# Patient Record
Sex: Male | Born: 1935 | Race: White | Hispanic: Yes | Marital: Married | Smoking: Former smoker
Health system: Southern US, Community
[De-identification: ages and names within clinical notes are randomized; demographics above are authoritative.]

## PROBLEM LIST (undated history)

## (undated) DIAGNOSIS — C61 Malignant neoplasm of prostate: Secondary | ICD-10-CM

## (undated) DIAGNOSIS — I10 Essential (primary) hypertension: Secondary | ICD-10-CM

## (undated) DIAGNOSIS — H9190 Unspecified hearing loss, unspecified ear: Secondary | ICD-10-CM

## (undated) DIAGNOSIS — H409 Unspecified glaucoma: Secondary | ICD-10-CM

## (undated) DIAGNOSIS — K5790 Diverticulosis of intestine, part unspecified, without perforation or abscess without bleeding: Secondary | ICD-10-CM

## (undated) DIAGNOSIS — Z974 Presence of external hearing-aid: Secondary | ICD-10-CM

## (undated) DIAGNOSIS — M1990 Unspecified osteoarthritis, unspecified site: Secondary | ICD-10-CM

## (undated) DIAGNOSIS — E785 Hyperlipidemia, unspecified: Secondary | ICD-10-CM

## (undated) DIAGNOSIS — D369 Benign neoplasm, unspecified site: Secondary | ICD-10-CM

## (undated) DIAGNOSIS — J302 Other seasonal allergic rhinitis: Secondary | ICD-10-CM

## (undated) DIAGNOSIS — I2510 Atherosclerotic heart disease of native coronary artery without angina pectoris: Secondary | ICD-10-CM

## (undated) HISTORY — PX: PROSTATE SURGERY: SHX751

## (undated) HISTORY — DX: Benign neoplasm, unspecified site: D36.9

## (undated) HISTORY — DX: Hyperlipidemia, unspecified: E78.5

## (undated) HISTORY — PX: OTHER SURGICAL HISTORY: SHX169

## (undated) HISTORY — DX: Malignant neoplasm of prostate: C61

## (undated) HISTORY — DX: Diverticulosis of intestine, part unspecified, without perforation or abscess without bleeding: K57.90

## (undated) HISTORY — PX: HERNIA REPAIR: SHX51

## (undated) HISTORY — PX: EYE SURGERY: SHX253

## (undated) HISTORY — PX: TRANSURETHRAL RESECTION OF PROSTATE: SHX73

## (undated) HISTORY — DX: Atherosclerotic heart disease of native coronary artery without angina pectoris: I25.10

---

## 1968-02-01 HISTORY — PX: BACK SURGERY: SHX140

## 2004-03-03 ENCOUNTER — Encounter: Admission: RE | Admit: 2004-03-03 | Discharge: 2004-03-03 | Payer: Self-pay | Admitting: Gastroenterology

## 2005-02-15 ENCOUNTER — Encounter: Admission: RE | Admit: 2005-02-15 | Discharge: 2005-03-17 | Payer: Self-pay | Admitting: Family Medicine

## 2005-04-12 ENCOUNTER — Ambulatory Visit (HOSPITAL_COMMUNITY): Admission: RE | Admit: 2005-04-12 | Discharge: 2005-04-13 | Payer: Self-pay | Admitting: Surgery

## 2010-11-01 HISTORY — PX: HERNIA REPAIR: SHX51

## 2012-04-16 ENCOUNTER — Encounter (INDEPENDENT_AMBULATORY_CARE_PROVIDER_SITE_OTHER): Payer: Self-pay | Admitting: Surgery

## 2012-04-17 ENCOUNTER — Ambulatory Visit (INDEPENDENT_AMBULATORY_CARE_PROVIDER_SITE_OTHER): Payer: Self-pay | Admitting: Surgery

## 2012-04-24 ENCOUNTER — Encounter (INDEPENDENT_AMBULATORY_CARE_PROVIDER_SITE_OTHER): Payer: Self-pay | Admitting: Surgery

## 2012-04-24 ENCOUNTER — Encounter (INDEPENDENT_AMBULATORY_CARE_PROVIDER_SITE_OTHER): Payer: Self-pay | Admitting: General Surgery

## 2012-04-24 ENCOUNTER — Ambulatory Visit (INDEPENDENT_AMBULATORY_CARE_PROVIDER_SITE_OTHER): Payer: Medicare Other | Admitting: Surgery

## 2012-04-24 VITALS — BP 124/84 | HR 72 | Temp 98.2°F | Resp 16 | Ht 70.0 in | Wt 179.2 lb

## 2012-04-24 DIAGNOSIS — K4091 Unilateral inguinal hernia, without obstruction or gangrene, recurrent: Secondary | ICD-10-CM

## 2012-04-24 NOTE — Progress Notes (Signed)
Patient ID: Jackson Diaz, male   DOB: February 08, 1914, 79 y.o.   MRN: [id number]  Chief Complaint  Patient presents with  . Hernia    HPI Jackson Diaz is a 79 y.o. male.   HPI This gentleman is referred by Harlow Asa for evaluation of A recurrent, symptomatic right inguinal hernia. He reports that it is getting larger and becoming more difficult to reduce. He also has occasional nausea and some slight pain from the hernia. He is otherwise without complaints. Past Medical History  Diagnosis Date  . Hyperlipidemia   . CAD (coronary artery disease)   . Prostate cancer   . Cystadenoma   . Diverticulosis     Past Surgical History  Procedure Laterality Date  . Eye surgery    . Transurethral resection of prostate    . Prostate surgery    . Hernia repair Right 10/12  . Hernia repair  [phone]  . Back surgery    . Hear loss      History reviewed. No pertinent family history.  Social History History  Substance Use Topics  . Smoking status: Former Games developer  . Smokeless tobacco: Not on file  . Alcohol Use: Yes     Comment: wine nightly    No Known Allergies  Current Outpatient Prescriptions  Medication Sig Dispense Refill  . acetaminophen (TYLENOL) 325 MG tablet Take 650 mg by mouth every 6 (six) hours as needed for pain.      Marland Kitchen aspirin 81 MG tablet Take 81 mg by mouth daily.      . bisoprolol (ZEBETA) 5 MG tablet Take 5 mg by mouth daily.      . diphenhydrAMINE (SOMINEX) 25 MG tablet Take 25 mg by mouth at bedtime as needed for sleep.      Marland Kitchen ezetimibe (ZETIA) 10 MG tablet Take 10 mg by mouth daily.      . isosorbide mononitrate (IMDUR) 60 MG 24 hr tablet Take 60 mg by mouth daily.      Marland Kitchen latanoprost (XALATAN) 0.005 % ophthalmic solution 1 drop at bedtime.      Marland Kitchen loratadine (CLARITIN) 10 MG tablet Take 10 mg by mouth daily.      . rosuvastatin (CRESTOR) 20 MG tablet Take 20 mg by mouth daily.      . timolol (BETIMOL) 0.5 % ophthalmic solution 1 drop 2 (two) times daily.        No current facility-administered medications for this visit.    Review of Systems Review of Systems  Unable to perform ROS   Blood pressure 124/84, pulse 72, temperature 98.2 F (36.8 C), temperature source Temporal, resp. rate 16, height 5\' 10"  (1.778 m), weight 179 lb 3.2 oz (81.285 kg).  Physical Exam Physical Exam  Constitutional: He appears well-developed and well-nourished. No distress.  HENT:  Head: Normocephalic and atraumatic.  Right Ear: External ear normal.  Left Ear: External ear normal.  Eyes: Conjunctivae are normal. Pupils are equal, round, and reactive to light.  Neck: Normal range of motion. Neck supple.  Cardiovascular: Normal rate, normal heart sounds and intact distal pulses.   Pulmonary/Chest: Effort normal and breath sounds normal. No respiratory distress. He has no wheezes.  Abdominal: Soft. Bowel sounds are normal. He exhibits no distension. There is no tenderness.  There is a large, difficult to reduce recurrent right inguinal hernia  Neurological: He is alert.  Psychiatric: His behavior is normal.    Data Reviewed   Assessment    Recurrent right inguinal hernia  Plan    Because of his symptoms as well as the difficulty reducing this hernia, repair is recommended. I am worried that this could become incarcerated and strangulated given the size and the symptoms he is having. I discussed this with the patient and his daughter. I discussed the risks of surgery which includes but is not limited to bleeding, infection, recurrence, chronic pain, and cardiopulmonary issues given his advanced age. I would try to do this with a TAP block by anesthesia and minimal sedation. I will have to get cardiac clearance preoperatively.        Fiorela Pelzer A 04/24/2012, 11:43 AM

## 2012-05-22 ENCOUNTER — Encounter (INDEPENDENT_AMBULATORY_CARE_PROVIDER_SITE_OTHER): Payer: Self-pay

## 2012-05-22 ENCOUNTER — Other Ambulatory Visit (INDEPENDENT_AMBULATORY_CARE_PROVIDER_SITE_OTHER): Payer: Self-pay | Admitting: Surgery

## 2012-06-11 ENCOUNTER — Encounter (HOSPITAL_BASED_OUTPATIENT_CLINIC_OR_DEPARTMENT_OTHER): Payer: Self-pay | Admitting: *Deleted

## 2012-06-11 NOTE — Progress Notes (Signed)
Talked with daughter-pt very hoh- To bring for bmet0saw cardiology 05/04/12-moderate risk-pt does live alone-has nursing aid come -he does not drive-walks with cane and walker-no further cardiac work up done-did review with dr Allyne Gee felt he was ok for here, but will review with directors-he is to stay overnight.

## 2012-06-13 ENCOUNTER — Encounter (HOSPITAL_BASED_OUTPATIENT_CLINIC_OR_DEPARTMENT_OTHER)
Admission: RE | Admit: 2012-06-13 | Discharge: 2012-06-13 | Disposition: A | Discharge status: Home / Self Care | Payer: Medicare Other | Source: Ambulatory Visit | Attending: Surgery | Admitting: Surgery

## 2012-06-13 LAB — BASIC METABOLIC PANEL
BUN: 22 mg/dL (ref 6–23)
Chloride: 106 mEq/L (ref 96–112)
Creatinine, Ser: 1.09 mg/dL (ref 0.50–1.35)
Glucose, Bld: 113 mg/dL — ABNORMAL HIGH (ref 70–99)
Potassium: 5 mEq/L (ref 3.5–5.1)

## 2012-06-13 NOTE — Progress Notes (Signed)
Talked with Jackson Diaz nursing director-if dr Gypsy Balsam oked pt -will be ok here.-to stay [facility] overnight

## 2012-06-18 ENCOUNTER — Encounter (HOSPITAL_BASED_OUTPATIENT_CLINIC_OR_DEPARTMENT_OTHER): Payer: Self-pay | Admitting: *Deleted

## 2012-06-18 ENCOUNTER — Ambulatory Visit (HOSPITAL_BASED_OUTPATIENT_CLINIC_OR_DEPARTMENT_OTHER)
Admission: RE | Admit: 2012-06-18 | Discharge: 2012-06-18 | Disposition: A | Discharge status: Home / Self Care | Payer: Medicare Other | Source: Ambulatory Visit | Attending: Surgery | Admitting: Surgery

## 2012-06-18 ENCOUNTER — Encounter (HOSPITAL_BASED_OUTPATIENT_CLINIC_OR_DEPARTMENT_OTHER)
Admission: RE | Disposition: A | Discharge status: Home / Self Care | Payer: Self-pay | Source: Ambulatory Visit | Attending: Surgery

## 2012-06-18 ENCOUNTER — Ambulatory Visit (HOSPITAL_BASED_OUTPATIENT_CLINIC_OR_DEPARTMENT_OTHER): Payer: Medicare Other | Admitting: Anesthesiology

## 2012-06-18 ENCOUNTER — Encounter (HOSPITAL_BASED_OUTPATIENT_CLINIC_OR_DEPARTMENT_OTHER): Payer: Self-pay | Admitting: Anesthesiology

## 2012-06-18 DIAGNOSIS — K4091 Unilateral inguinal hernia, without obstruction or gangrene, recurrent: Secondary | ICD-10-CM | POA: Insufficient documentation

## 2012-06-18 DIAGNOSIS — Z79899 Other long term (current) drug therapy: Secondary | ICD-10-CM | POA: Insufficient documentation

## 2012-06-18 DIAGNOSIS — E785 Hyperlipidemia, unspecified: Secondary | ICD-10-CM | POA: Insufficient documentation

## 2012-06-18 DIAGNOSIS — Z9079 Acquired absence of other genital organ(s): Secondary | ICD-10-CM | POA: Insufficient documentation

## 2012-06-18 DIAGNOSIS — Z7982 Long term (current) use of aspirin: Secondary | ICD-10-CM | POA: Insufficient documentation

## 2012-06-18 DIAGNOSIS — Z87891 Personal history of nicotine dependence: Secondary | ICD-10-CM | POA: Insufficient documentation

## 2012-06-18 DIAGNOSIS — K5730 Diverticulosis of large intestine without perforation or abscess without bleeding: Secondary | ICD-10-CM | POA: Insufficient documentation

## 2012-06-18 DIAGNOSIS — Z01812 Encounter for preprocedural laboratory examination: Secondary | ICD-10-CM | POA: Insufficient documentation

## 2012-06-18 DIAGNOSIS — Z8546 Personal history of malignant neoplasm of prostate: Secondary | ICD-10-CM | POA: Insufficient documentation

## 2012-06-18 DIAGNOSIS — I2510 Atherosclerotic heart disease of native coronary artery without angina pectoris: Secondary | ICD-10-CM | POA: Insufficient documentation

## 2012-06-18 HISTORY — DX: Unspecified osteoarthritis, unspecified site: M19.90

## 2012-06-18 HISTORY — DX: Unspecified hearing loss, unspecified ear: H91.90

## 2012-06-18 HISTORY — DX: Essential (primary) hypertension: I10

## 2012-06-18 HISTORY — DX: Unspecified glaucoma: H40.9

## 2012-06-18 HISTORY — DX: Other seasonal allergic rhinitis: J30.2

## 2012-06-18 HISTORY — PX: INGUINAL HERNIA REPAIR: SHX194

## 2012-06-18 HISTORY — PX: INSERTION OF MESH: SHX5868

## 2012-06-18 HISTORY — DX: Presence of external hearing-aid: Z97.4

## 2012-06-18 SURGERY — REPAIR, HERNIA, INGUINAL, ADULT
Anesthesia: Monitor Anesthesia Care | Site: Groin | Laterality: Right | Wound class: Clean

## 2012-06-18 MED ORDER — HYDROCODONE-ACETAMINOPHEN 5-325 MG PO TABS: 1.0000 | ORAL_TABLET | ORAL | Status: DC | PRN

## 2012-06-18 MED ORDER — OXYCODONE HCL 5 MG PO TABS: 5.0000 mg | ORAL_TABLET | ORAL | Status: DC | PRN

## 2012-06-18 MED ORDER — SODIUM CHLORIDE 0.9 % IV SOLN: 250.0000 mL | INTRAVENOUS | Status: DC | PRN

## 2012-06-18 MED ORDER — ACETAMINOPHEN 325 MG PO TABS: 650.0000 mg | ORAL_TABLET | ORAL | Status: DC | PRN

## 2012-06-18 MED ORDER — FENTANYL CITRATE 0.05 MG/ML IJ SOLN
INTRAMUSCULAR | Status: DC | PRN
  Administered 2012-06-18 (×2): 25 ug via INTRAVENOUS

## 2012-06-18 MED ORDER — FENTANYL CITRATE 0.05 MG/ML IJ SOLN: 25.0000 ug | INTRAMUSCULAR | Status: DC | PRN

## 2012-06-18 MED ORDER — ONDANSETRON HCL 4 MG/2ML IJ SOLN
INTRAMUSCULAR | Status: DC | PRN
  Administered 2012-06-18: 4 mg via INTRAVENOUS

## 2012-06-18 MED ORDER — ONDANSETRON HCL 4 MG/2ML IJ SOLN: 4.0000 mg | Freq: Four times a day (QID) | INTRAMUSCULAR | Status: DC | PRN

## 2012-06-18 MED ORDER — ONDANSETRON HCL 4 MG/2ML IJ SOLN: 4.0000 mg | Freq: Once | INTRAMUSCULAR | Status: DC | PRN

## 2012-06-18 MED ORDER — LIDOCAINE HCL (CARDIAC) 20 MG/ML IV SOLN
INTRAVENOUS | Status: DC | PRN
  Administered 2012-06-18: 50 mg via INTRAVENOUS

## 2012-06-18 MED ORDER — PROPOFOL INFUSION 10 MG/ML OPTIME
INTRAVENOUS | Status: DC | PRN
  Administered 2012-06-18: 50 ug/kg/min via INTRAVENOUS

## 2012-06-18 MED ORDER — SODIUM CHLORIDE 0.9 % IJ SOLN: 3.0000 mL | Freq: Two times a day (BID) | INTRAMUSCULAR | Status: DC

## 2012-06-18 MED ORDER — MORPHINE SULFATE 2 MG/ML IJ SOLN: 1.0000 mg | INTRAMUSCULAR | Status: DC | PRN

## 2012-06-18 MED ORDER — SODIUM CHLORIDE 0.9 % IJ SOLN: 3.0000 mL | INTRAMUSCULAR | Status: DC | PRN

## 2012-06-18 MED ORDER — LACTATED RINGERS IV SOLN
INTRAVENOUS | Status: DC
  Administered 2012-06-18: 09:00:00 via INTRAVENOUS

## 2012-06-18 MED ORDER — LIDOCAINE HCL 1 % IJ SOLN
INTRAMUSCULAR | Status: DC | PRN
  Administered 2012-06-18: 09:00:00 via INTRAMUSCULAR

## 2012-06-18 MED ORDER — CEFAZOLIN SODIUM-DEXTROSE 2-3 GM-% IV SOLR
2.0000 g | INTRAVENOUS | Status: AC
  Administered 2012-06-18: 2 g via INTRAVENOUS

## 2012-06-18 MED ORDER — ACETAMINOPHEN 650 MG RE SUPP: 650.0000 mg | RECTAL | Status: DC | PRN

## 2012-06-18 SURGICAL SUPPLY — 45 items
APL SKNCLS STERI-STRIP NONHPOA (GAUZE/BANDAGES/DRESSINGS)
BENZOIN TINCTURE PRP APPL 2/3 (GAUZE/BANDAGES/DRESSINGS) ×2 IMPLANT
BLADE HEX COATED 2.75 (ELECTRODE) ×3 IMPLANT
BLADE SURG 10 STRL SS (BLADE) ×2 IMPLANT
BLADE SURG ROTATE 9660 (MISCELLANEOUS) ×3 IMPLANT
CHLORAPREP W/TINT 26ML (MISCELLANEOUS) ×3 IMPLANT
CLOTH BEACON ORANGE TIMEOUT ST (SAFETY) ×3 IMPLANT
COVER MAYO STAND STRL (DRAPES) ×3 IMPLANT
COVER TABLE BACK 60X90 (DRAPES) ×3 IMPLANT
DECANTER SPIKE VIAL GLASS SM (MISCELLANEOUS) ×4 IMPLANT
DRAIN PENROSE 1/2X12 LTX STRL (WOUND CARE) ×3 IMPLANT
DRAPE PED LAPAROTOMY (DRAPES) ×3 IMPLANT
DRAPE UTILITY XL STRL (DRAPES) ×3 IMPLANT
DRSG TEGADERM 4X4.75 (GAUZE/BANDAGES/DRESSINGS) ×3 IMPLANT
ELECT REM PT RETURN 9FT ADLT (ELECTROSURGICAL) ×3
ELECTRODE REM PT RTRN 9FT ADLT (ELECTROSURGICAL) ×2 IMPLANT
GAUZE SPONGE 4X4 12PLY STRL LF (GAUZE/BANDAGES/DRESSINGS) ×3 IMPLANT
GLOVE ECLIPSE 6.5 STRL STRAW (GLOVE) ×1 IMPLANT
GLOVE INDICATOR 6.5 STRL GRN (GLOVE) ×1 IMPLANT
GLOVE SURG SIGNA 7.5 PF LTX (GLOVE) ×3 IMPLANT
GOWN PREVENTION PLUS XLARGE (GOWN DISPOSABLE) ×3 IMPLANT
GOWN PREVENTION PLUS XXLARGE (GOWN DISPOSABLE) ×3 IMPLANT
MESH PARIETEX PROGRIP RIGHT (Mesh General) ×1 IMPLANT
NDL HYPO 25X1 1.5 SAFETY (NEEDLE) ×2 IMPLANT
NEEDLE HYPO 22GX1.5 SAFETY (NEEDLE) ×3 IMPLANT
NEEDLE HYPO 25X1 1.5 SAFETY (NEEDLE) ×3 IMPLANT
NS IRRIG 1000ML POUR BTL (IV SOLUTION) IMPLANT
PACK BASIN DAY SURGERY FS (CUSTOM PROCEDURE TRAY) ×3 IMPLANT
PENCIL BUTTON HOLSTER BLD 10FT (ELECTRODE) ×3 IMPLANT
SLEEVE SCD COMPRESS KNEE MED (MISCELLANEOUS) IMPLANT
SPONGE INTESTINAL PEANUT (DISPOSABLE) IMPLANT
SPONGE LAP 4X18 X RAY DECT (DISPOSABLE) ×3 IMPLANT
STRIP CLOSURE SKIN 1/2X4 (GAUZE/BANDAGES/DRESSINGS) ×3 IMPLANT
SUT MNCRL AB 4-0 PS2 18 (SUTURE) ×3 IMPLANT
SUT SILK 2 0 SH (SUTURE) ×1 IMPLANT
SUT SURG 0 T 19/GS 22 1969 62 (SUTURE) IMPLANT
SUT VIC AB 2-0 CT1 27 (SUTURE) ×3
SUT VIC AB 2-0 CT1 TAPERPNT 27 (SUTURE) ×2 IMPLANT
SUT VIC AB 3-0 CT1 27 (SUTURE) ×3
SUT VIC AB 3-0 CT1 27XBRD (SUTURE) ×2 IMPLANT
SUT VICRYL AB 3 0 TIES (SUTURE) ×3 IMPLANT
SYR BULB 3OZ (MISCELLANEOUS) IMPLANT
SYR CONTROL 10ML LL (SYRINGE) ×3 IMPLANT
TOWEL OR 17X24 6PK STRL BLUE (TOWEL DISPOSABLE) ×5 IMPLANT
TOWEL OR NON WOVEN STRL DISP B (DISPOSABLE) ×3 IMPLANT

## 2012-06-18 NOTE — Interval H&P Note (Signed)
History and Physical Interval Note: no change in H and P  06/18/2012 7:20 AM  Jackson Diaz  has presented today for surgery, with the diagnosis of recurrent right inguinal hernia  The various methods of treatment have been discussed with the patient and family. After consideration of risks, benefits and other options for treatment, the patient has consented to  Procedure(s): REPAIR RECURRENT RIGHT INGUINALHERNIA WITH MESH (Right) INSERTION OF MESH (Right) as a surgical intervention .  The patient's history has been reviewed, patient examined, no change in status, stable for surgery.  I have reviewed the patient's chart and labs.  Questions were answered to the patient's satisfaction.     Navraj Dreibelbis A

## 2012-06-18 NOTE — H&P (Signed)
Patient ID: Jackson Diaz, male DOB: 05/15/1914, 79 y.o. MRN: [id number]  Chief Complaint   Patient presents with   .  Hernia   HPI  Jackson Diaz is a 79 y.o. male.  HPI  This gentleman is referred by Harlow Asa for evaluation of A recurrent, symptomatic right inguinal hernia. He reports that it is getting larger and becoming more difficult to reduce. He also has occasional nausea and some slight pain from the hernia. He is otherwise without complaints.  Past Medical History   Diagnosis  Date   .  Hyperlipidemia    .  CAD (coronary artery disease)    .  Prostate cancer    .  Cystadenoma    .  Diverticulosis     Past Surgical History   Procedure  Laterality  Date   .  Eye surgery     .  Transurethral resection of prostate     .  Prostate surgery     .  Hernia repair  Right  10/12   .  Hernia repair   [phone]   .  Back surgery     .  Hear loss     History reviewed. No pertinent family history.  Social History  History   Substance Use Topics   .  Smoking status:  Former Games developer   .  Smokeless tobacco:  Not on file   .  Alcohol Use:  Yes      Comment: wine nightly   No Known Allergies  Current Outpatient Prescriptions   Medication  Sig  Dispense  Refill   .  acetaminophen (TYLENOL) 325 MG tablet  Take 650 mg by mouth every 6 (six) hours as needed for pain.     Marland Kitchen  aspirin 81 MG tablet  Take 81 mg by mouth daily.     .  bisoprolol (ZEBETA) 5 MG tablet  Take 5 mg by mouth daily.     .  diphenhydrAMINE (SOMINEX) 25 MG tablet  Take 25 mg by mouth at bedtime as needed for sleep.     Marland Kitchen  ezetimibe (ZETIA) 10 MG tablet  Take 10 mg by mouth daily.     .  isosorbide mononitrate (IMDUR) 60 MG 24 hr tablet  Take 60 mg by mouth daily.     Marland Kitchen  latanoprost (XALATAN) 0.005 % ophthalmic solution  1 drop at bedtime.     Marland Kitchen  loratadine (CLARITIN) 10 MG tablet  Take 10 mg by mouth daily.     .  rosuvastatin (CRESTOR) 20 MG tablet  Take 20 mg by mouth daily.     .  timolol (BETIMOL) 0.5  % ophthalmic solution  1 drop 2 (two) times daily.      No current facility-administered medications for this visit.   Review of Systems  Review of Systems  Unable to perform ROS  Blood pressure 124/84, pulse 72, temperature 98.2 F (36.8 C), temperature source Temporal, resp. rate 16, height 5\' 10"  (1.778 m), weight 179 lb 3.2 oz (81.285 kg).  Physical Exam  Physical Exam  Constitutional: He appears well-developed and well-nourished. No distress.  HENT:  Head: Normocephalic and atraumatic.  Right Ear: External ear normal.  Left Ear: External ear normal.  Eyes: Conjunctivae are normal. Pupils are equal, round, and reactive to light.  Neck: Normal range of motion. Neck supple.  Cardiovascular: Normal rate, normal heart sounds and intact distal pulses.  Pulmonary/Chest: Effort normal and breath sounds normal. No respiratory distress. He  has no wheezes.  Abdominal: Soft. Bowel sounds are normal. He exhibits no distension. There is no tenderness.  There is a large, difficult to reduce recurrent right inguinal hernia  Neurological: He is alert.  Psychiatric: His behavior is normal.  Data Reviewed  Assessment  Recurrent right inguinal hernia  Plan  Because of his symptoms as well as the difficulty reducing this hernia, repair is recommended. I am worried that this could become incarcerated and strangulated given the size and the symptoms he is having. I discussed this with the patient and his daughter. I discussed the risks of surgery which includes but is not limited to bleeding, infection, recurrence, chronic pain, and cardiopulmonary issues given his advanced age. I would try to do this with a TAP block by anesthesia and minimal sedation. I will have to get cardiac clearance preoperatively.

## 2012-06-18 NOTE — Op Note (Signed)
REPAIR RECURRENT RIGHT INGUINALHERNIA WITH MESH, INSERTION OF MESH  Procedure Note  Jackson Diaz 06/18/2012   Pre-op Diagnosis: recurrent right inguinal hernia     Post-op Diagnosis: same  Procedure(s): REPAIR RECURRENT RIGHT INGUINALHERNIA WITH MESH INSERTION OF MESH  Surgeon(s): Shelly Rubenstein, MD  Anesthesia: Monitor Anesthesia Care  Staff:  Circulator: Aleen Sells, RN Relief Circulator: Salley Scarlet, RN Scrub Person: Idell Pickles, CST  Estimated Blood Loss: Minimal                         [name]   Date: 06/18/2012  Time: 9:25 AM

## 2012-06-18 NOTE — Anesthesia Postprocedure Evaluation (Signed)
  Anesthesia Post-op Note  Patient: Jackson Diaz  Procedure(s) Performed: Procedure(s): REPAIR RECURRENT RIGHT INGUINALHERNIA WITH MESH (Right) INSERTION OF MESH (Right)  Patient Location: PACU  Anesthesia Type:MAC  Level of Consciousness: awake, alert  and oriented  Airway and Oxygen Therapy: Patient Spontanous Breathing and Patient connected to face mask oxygen  Post-op Pain: mild  Post-op Assessment: Post-op Vital signs reviewed  Post-op Vital Signs: Reviewed  Complications: No apparent anesthesia complications

## 2012-06-18 NOTE — Anesthesia Preprocedure Evaluation (Addendum)
Anesthesia Evaluation  Patient identified by MRN, date of birth, ID band Patient awake    Reviewed: Allergy & Precautions, H&P , NPO status , Patient's Chart, lab work & pertinent test results  Airway Mallampati: I TM Distance: >3 FB Neck ROM: Full    Dental  (+) Upper Dentures, Lower Dentures, Teeth Intact and Dental Advisory Given   Pulmonary  breath sounds clear to auscultation        Cardiovascular hypertension, Pt. on medications + CAD Rhythm:Regular Rate:Normal     Neuro/Psych    GI/Hepatic   Endo/Other    Renal/GU      Musculoskeletal   Abdominal   Peds  Hematology   Anesthesia Other Findings   Reproductive/Obstetrics                          Anesthesia Physical Anesthesia Plan  ASA: II  Anesthesia Plan: MAC   Post-op Pain Management:    Induction: Intravenous  Airway Management Planned: Simple Face Mask  Additional Equipment:   Intra-op Plan:   Post-operative Plan:   Informed Consent: I have reviewed the patients History and Physical, chart, labs and discussed the procedure including the risks, benefits and alternatives for the proposed anesthesia with the patient or authorized representative who has indicated his/her understanding and acceptance.   Dental advisory given  Plan Discussed with: CRNA, Anesthesiologist and Surgeon  Anesthesia Plan Comments:         Anesthesia Quick Evaluation

## 2012-06-18 NOTE — Transfer of Care (Signed)
Immediate Anesthesia Transfer of Care Note  Patient: Jackson Diaz  Procedure(s) Performed: Procedure(s): REPAIR RECURRENT RIGHT INGUINALHERNIA WITH MESH (Right) INSERTION OF MESH (Right)  Patient Location: PACU  Anesthesia Type:MAC  Level of Consciousness: sedated  Airway & Oxygen Therapy: Patient Spontanous Breathing and Patient connected to face mask oxygen  Post-op Assessment: Report given to PACU RN and Post -op Vital signs reviewed and stable  Post vital signs: Reviewed and stable  Complications: No apparent anesthesia complications

## 2012-06-19 ENCOUNTER — Encounter (HOSPITAL_BASED_OUTPATIENT_CLINIC_OR_DEPARTMENT_OTHER): Payer: Self-pay | Admitting: Surgery

## 2012-06-19 NOTE — Op Note (Signed)
NAMECRISTO, AUSBURN                ACCOUNT NO.:  [id number]  MEDICAL RECORD NO.:  [id number]  LOCATION:                                 FACILITY:  PHYSICIAN:  Abigail Miyamoto, M.D. DATE OF BIRTH:  05/19/1914  DATE OF PROCEDURE:  06/18/2012 DATE OF DISCHARGE:                              OPERATIVE REPORT   PREOPERATIVE DIAGNOSIS:  Recurrent right inguinal hernia.  POSTOPERATIVE DIAGNOSIS:  Recurrent right inguinal hernia.  PROCEDURE:  Repair of recurrent right inguinal hernia with mesh.  SURGEON:  Abigail Miyamoto, M.D.  ANESTHESIA:  1% lidocaine, 0.5% Marcaine, and monitored anesthesia care.  ESTIMATED BLOOD LOSS:  Minimal.  FINDINGS:  The patient was found to have a large indirect hernia containing colon.  PROCEDURE IN DETAIL:  The patient was brought to the operating room, identified as Jackson Diaz.  He was placed supine on the operative table and anesthesia was induced.  His right lower quadrant was then prepped and draped in usual sterile fashion.  I performed an ilioinguinal nerve block with a mixture of 1% lidocaine with 0.5% Marcaine plain.  I anesthetized the skin also with this mixture.  I then made a longitudinal incision through the previous scar in the right groin with a scalpel.  I took this down through Scarpa fascia and old scar with the electrocautery.  The patient had a very large indirect hernia defect. The external oblique musculature was basically gone.  I controlled the testicular cord and structures with a Penrose drain.  I was able to free up the sac in its entirety, and basically reduce the contents and sac back into the abdominal cavity.  I then closed the internal ring with several interrupted silk sutures.  A piece of Parietex ProGrip mesh was then brought onto the field.  I placed it around the cord structures.  I then sutured in circumferentially with interrupted 2-0 Vicryl sutures. I then anesthetized the incision further with Marcaine and  lidocaine.  I closed the subcutaneous tissue over top of the mesh with interrupted 3-0 Vicryl sutures and closed the skin with running 4-0 Monocryl.  Steri- Strips, gauze, and Tegaderm were then applied.  The patient tolerated the procedure well.  All the counts were correct at the end of the procedure.  The patient was then taken in a stable condition from the operating room to the recovery room.     Abigail Miyamoto, M.D.    DB/MEDQ  D:  06/18/2012  T:  06/19/2012  Job:  [id number]

## 2012-06-26 ENCOUNTER — Telehealth (INDEPENDENT_AMBULATORY_CARE_PROVIDER_SITE_OTHER): Payer: Self-pay | Admitting: General Surgery

## 2012-06-26 NOTE — Telephone Encounter (Signed)
Patient's daughter called - he is status post hernia repair on 06/18/2012. He had some problems with diarrhea on Saturday. He had very loose stools with some blood mixed in 4-5 x daily on Saturday and Sunday. This has gotten better since. He is eating and drinking okay. His incision looks good. No redness or drainage. It is hard to the touch, but I explained that his incisions would be hard. I told her that since the diarrhea is getting better there is no need to take any medications or for Dr Magnus Ivan to see him sooner. I advised I would send a message to Dr Magnus Ivan and let them know if he has any suggestions.

## 2012-06-26 NOTE — Telephone Encounter (Signed)
i agree.  No other suggestions

## 2012-07-06 ENCOUNTER — Encounter (INDEPENDENT_AMBULATORY_CARE_PROVIDER_SITE_OTHER): Payer: Self-pay | Admitting: Surgery

## 2012-07-06 ENCOUNTER — Ambulatory Visit (INDEPENDENT_AMBULATORY_CARE_PROVIDER_SITE_OTHER): Payer: Medicare Other | Admitting: Surgery

## 2012-07-06 VITALS — BP 124/86 | HR 78 | Temp 97.7°F | Resp 14 | Ht 69.0 in | Wt 178.4 lb

## 2012-07-06 DIAGNOSIS — Z09 Encounter for follow-up examination after completed treatment for conditions other than malignant neoplasm: Secondary | ICD-10-CM

## 2012-07-06 NOTE — Progress Notes (Signed)
Subjective:     Patient ID: Jackson Diaz, male   DOB: [birth year], 79 y.o.   MRN: [id number]  HPI He is here for his first postop visit status post right inguinal hernia repair with mesh. His daughter reports that he did well from a Mentalstandpoint he has no complaints  Review of Systems     Objective:   Physical Exam On exam, his incision is well-healed without evidence of recurrence    Assessment:     Patient stable postop     Plan:     He may resume his normal activity. I will see him back as needed

## 2013-02-22 ENCOUNTER — Emergency Department (HOSPITAL_COMMUNITY)
Admission: EM | Admit: 2013-02-22 | Discharge: 2013-02-22 | Disposition: A | Discharge status: Home / Self Care | Payer: Medicare Other | Attending: Emergency Medicine | Admitting: Emergency Medicine

## 2013-02-22 ENCOUNTER — Encounter (HOSPITAL_COMMUNITY): Payer: Self-pay | Admitting: Emergency Medicine

## 2013-02-22 ENCOUNTER — Emergency Department (HOSPITAL_COMMUNITY): Payer: Medicare Other

## 2013-02-22 DIAGNOSIS — M129 Arthropathy, unspecified: Secondary | ICD-10-CM | POA: Insufficient documentation

## 2013-02-22 DIAGNOSIS — R220 Localized swelling, mass and lump, head: Secondary | ICD-10-CM | POA: Insufficient documentation

## 2013-02-22 DIAGNOSIS — Z87898 Personal history of other specified conditions: Secondary | ICD-10-CM | POA: Insufficient documentation

## 2013-02-22 DIAGNOSIS — F0390 Unspecified dementia without behavioral disturbance: Secondary | ICD-10-CM | POA: Insufficient documentation

## 2013-02-22 DIAGNOSIS — R5381 Other malaise: Secondary | ICD-10-CM | POA: Insufficient documentation

## 2013-02-22 DIAGNOSIS — I2510 Atherosclerotic heart disease of native coronary artery without angina pectoris: Secondary | ICD-10-CM | POA: Insufficient documentation

## 2013-02-22 DIAGNOSIS — Z8709 Personal history of other diseases of the respiratory system: Secondary | ICD-10-CM | POA: Insufficient documentation

## 2013-02-22 DIAGNOSIS — Z8546 Personal history of malignant neoplasm of prostate: Secondary | ICD-10-CM | POA: Insufficient documentation

## 2013-02-22 DIAGNOSIS — Z7982 Long term (current) use of aspirin: Secondary | ICD-10-CM | POA: Insufficient documentation

## 2013-02-22 DIAGNOSIS — R443 Hallucinations, unspecified: Secondary | ICD-10-CM | POA: Insufficient documentation

## 2013-02-22 DIAGNOSIS — R221 Localized swelling, mass and lump, neck: Secondary | ICD-10-CM

## 2013-02-22 DIAGNOSIS — Z8719 Personal history of other diseases of the digestive system: Secondary | ICD-10-CM | POA: Insufficient documentation

## 2013-02-22 DIAGNOSIS — I10 Essential (primary) hypertension: Secondary | ICD-10-CM | POA: Insufficient documentation

## 2013-02-22 DIAGNOSIS — E785 Hyperlipidemia, unspecified: Secondary | ICD-10-CM | POA: Insufficient documentation

## 2013-02-22 DIAGNOSIS — H538 Other visual disturbances: Secondary | ICD-10-CM | POA: Insufficient documentation

## 2013-02-22 DIAGNOSIS — M7989 Other specified soft tissue disorders: Secondary | ICD-10-CM | POA: Insufficient documentation

## 2013-02-22 DIAGNOSIS — Z79899 Other long term (current) drug therapy: Secondary | ICD-10-CM | POA: Insufficient documentation

## 2013-02-22 DIAGNOSIS — T7840XA Allergy, unspecified, initial encounter: Secondary | ICD-10-CM

## 2013-02-22 DIAGNOSIS — Z87891 Personal history of nicotine dependence: Secondary | ICD-10-CM | POA: Insufficient documentation

## 2013-02-22 DIAGNOSIS — T450X5A Adverse effect of antiallergic and antiemetic drugs, initial encounter: Secondary | ICD-10-CM | POA: Insufficient documentation

## 2013-02-22 DIAGNOSIS — R5383 Other fatigue: Principal | ICD-10-CM

## 2013-02-22 LAB — COMPREHENSIVE METABOLIC PANEL
ALT: 17 U/L (ref 0–53)
AST: 40 U/L — ABNORMAL HIGH (ref 0–37)
Albumin: 3.7 g/dL (ref 3.5–5.2)
Alkaline Phosphatase: 49 U/L (ref 39–117)
BILIRUBIN TOTAL: 1 mg/dL (ref 0.3–1.2)
BUN: 22 mg/dL (ref 6–23)
CALCIUM: 8.9 mg/dL (ref 8.4–10.5)
CO2: 25 meq/L (ref 19–32)
Chloride: 104 mEq/L (ref 96–112)
Creatinine, Ser: 0.98 mg/dL (ref 0.50–1.35)
GFR, EST AFRICAN AMERICAN: 78 mL/min — AB (ref >90)
GFR, EST NON AFRICAN AMERICAN: 67 mL/min — AB (ref >90)
GLUCOSE: 106 mg/dL — AB (ref 70–99)
Potassium: 4.2 mEq/L (ref 3.7–5.3)
SODIUM: 140 meq/L (ref 137–147)
Total Protein: 6.5 g/dL (ref 6.0–8.3)

## 2013-02-22 LAB — CBC WITH DIFFERENTIAL/PLATELET
Basophils Absolute: 0 10*3/uL (ref 0.0–0.1)
Basophils Relative: 0 % (ref 0–1)
EOS PCT: 0 % (ref 0–5)
Eosinophils Absolute: 0 10*3/uL (ref 0.0–0.7)
HEMATOCRIT: 38.9 % — AB (ref 39.0–52.0)
Hemoglobin: 13 g/dL (ref 13.0–17.0)
LYMPHS ABS: 0.5 10*3/uL — AB (ref 0.7–4.0)
LYMPHS PCT: 5 % — AB (ref 12–46)
MCH: 32.4 pg (ref 26.0–34.0)
MCHC: 33.4 g/dL (ref 30.0–36.0)
MCV: 97 fL (ref 78.0–100.0)
MONO ABS: 0.8 10*3/uL (ref 0.1–1.0)
Monocytes Relative: 8 % (ref 3–12)
Neutro Abs: 9.6 10*3/uL — ABNORMAL HIGH (ref 1.7–7.7)
Neutrophils Relative %: 87 % — ABNORMAL HIGH (ref 43–77)
Platelets: 131 10*3/uL — ABNORMAL LOW (ref 150–400)
RBC: 4.01 MIL/uL — AB (ref 4.22–5.81)
RDW: 13.6 % (ref 11.5–15.5)
WBC: 10.9 10*3/uL — AB (ref 4.0–10.5)

## 2013-02-22 LAB — URINALYSIS, ROUTINE W REFLEX MICROSCOPIC
GLUCOSE, UA: NEGATIVE mg/dL
Ketones, ur: NEGATIVE mg/dL
Leukocytes, UA: NEGATIVE
Nitrite: NEGATIVE
Protein, ur: NEGATIVE mg/dL
SPECIFIC GRAVITY, URINE: 1.022 (ref 1.005–1.030)
Urobilinogen, UA: 0.2 mg/dL (ref 0.0–1.0)
pH: 5.5 (ref 5.0–8.0)

## 2013-02-22 LAB — URINE MICROSCOPIC-ADD ON

## 2013-02-22 MED ORDER — PREDNISONE 50 MG PO TABS: ORAL_TABLET | ORAL | Status: DC

## 2013-02-22 MED ORDER — SODIUM CHLORIDE 0.9 % IV BOLUS (SEPSIS)
500.0000 mL | Freq: Once | INTRAVENOUS | Status: AC
  Administered 2013-02-22: 500 mL via INTRAVENOUS

## 2013-02-22 MED ORDER — PREDNISONE 20 MG PO TABS
60.0000 mg | ORAL_TABLET | Freq: Once | ORAL | Status: AC
  Administered 2013-02-22: 60 mg via ORAL
  Filled 2013-02-22: qty 3

## 2013-02-22 NOTE — ED Notes (Signed)
Patient went to his primary care doctor today after waking up with some facial and tongue swelling and bilateral hand swelling.  Primary care MD sent him here.  Patient was given 25mg  Benadryl po, then EMS gave him 25mg  Benadryl IV and 50mg  Zantac.

## 2013-02-22 NOTE — ED Notes (Signed)
Bed: NV91 Expected date:  Expected time:  Means of arrival:  Comments: EMS-allergic reaction

## 2013-02-22 NOTE — ED Provider Notes (Signed)
3:53 PM 48M here w/ possible allergic rxn. Pt got benadryl pta. Mildly drowsy, but did get 50mg  benadryl. Getting screening imaging/labs and monitoring.   5:49 PM: CXR showing cardiomegaly and mild bilat pleural effusions, likely not acute. Labs/UA otherwise non-contrib. Patient continues to appear well. He has mild erythema which persists or in the left eye but he denies any tongue swelling in his hands now appear normal. The family would like to go home and I think this is reasonable. Will place on prednisone for 4 days and have the patient follow up with his primary care doctor on Monday. I suspect his mild drowsiness here is attributed to the 50 mg of Benadryl that he got prior to arrival. One of the family gives a lower dose of Benadryl at home as needed. I have discussed the diagnosis/risks/treatment options with the patient and family and believe the pt to be eligible for discharge home to follow-up with pcp monday. We also discussed returning to the ED immediately if new or worsening sx occur. We discussed the sx which are most concerning (e.g., worsening rash, mouth/tongue swelling, fever, AMS) that necessitate immediate return. Any new prescriptions provided to the patient are listed below.  New Prescriptions   PREDNISONE (DELTASONE) 50 MG TABLET    Take 1 tablet by mouth daily for the next 4 days.   Clinical Impression 1. Allergic reaction    '  Blanchard Kelch, MD 02/22/13 [phone]

## 2013-02-22 NOTE — ED Provider Notes (Signed)
CSN: [id number]     Arrival date & time 02/22/13  1400 History   First MD Initiated Contact with Patient 02/22/13 1403     Chief Complaint  Patient presents with  . Allergic Reaction   (Consider location/radiation/quality/duration/timing/severity/associated sxs/prior Treatment) HPI Comments: 80 yo male with past smoking hx, mild dementia, right inguinal hernia repair hx presents with left facial and hand swelling bilateral noticed today.  Family helps take care of patient, he is very functional and alert for his age.  No new foods, meds or other exposures except new soap and oatmeal.  No breathing difficulty but congested new today.  No ACEI.  No hx of similar.  Pruritic. Benadryl on route.   Patient is a 80 y.o. male presenting with allergic reaction. The history is provided by the patient.  Allergic Reaction Presenting symptoms: rash     Past Medical History  Diagnosis Date  . Hyperlipidemia   . CAD (coronary artery disease)   . Prostate cancer   . Cystadenoma   . Diverticulosis   . Hypertension   . Glaucoma   . Arthritis   . Seasonal allergies   . HOH (hard of hearing)   . Wears hearing aid    Past Surgical History  Procedure Laterality Date  . Eye surgery    . Transurethral resection of prostate    . Prostate surgery    . Hernia repair Right 10/12  . Hernia repair  [phone]  . Hear loss    . Back surgery  1970  . Inguinal hernia repair Right 06/18/2012    Procedure: REPAIR RECURRENT RIGHT INGUINALHERNIA WITH MESH;  Surgeon: Harl Bowie, MD;  Location: [place];  Service: General;  Laterality: Right;  . Insertion of mesh Right 06/18/2012    Procedure: INSERTION OF MESH;  Surgeon: Harl Bowie, MD;  Location: [place];  Service: General;  Laterality: Right;   No family history on file. History  Substance Use Topics  . Smoking status: Former Smoker    Quit date: 06/11/1972  . Smokeless tobacco: Not on file  . Alcohol  Use: Yes     Comment: wine nightly    Review of Systems  Constitutional: Positive for fatigue. Negative for fever and chills.  HENT: Negative for congestion.   Eyes: Positive for visual disturbance (chronic).  Respiratory: Negative for shortness of breath.   Cardiovascular: Negative for chest pain.  Gastrointestinal: Negative for vomiting and abdominal pain.  Genitourinary: Negative for flank pain.  Musculoskeletal: Negative for back pain, neck pain and neck stiffness.  Skin: Positive for rash.  Neurological: Negative for headaches.  Psychiatric/Behavioral: Positive for hallucinations.    Allergies  Review of patient's allergies indicates no known allergies.  Home Medications   Current Outpatient Rx  Name  Route  Sig  Dispense  Refill  . acetaminophen (TYLENOL) 325 MG tablet   Oral   Take 650 mg by mouth every 6 (six) hours as needed for pain.         Marland Kitchen aspirin 81 MG tablet   Oral   Take 81 mg by mouth daily.         . bisoprolol (ZEBETA) 5 MG tablet   Oral   Take 5 mg by mouth daily.         . brimonidine (ALPHAGAN) 0.2 % ophthalmic solution   Both Eyes   Place 1 drop into both eyes 2 (two) times daily between meals as needed.         Marland Kitchen  diphenhydrAMINE (SOMINEX) 25 MG tablet   Oral   Take 25 mg by mouth at bedtime as needed for sleep.         Marland Kitchen ezetimibe (ZETIA) 10 MG tablet   Oral   Take 10 mg by mouth daily.         . isosorbide mononitrate (IMDUR) 60 MG 24 hr tablet   Oral   Take 60 mg by mouth daily.         Marland Kitchen latanoprost (XALATAN) 0.005 % ophthalmic solution      1 drop at bedtime.         Marland Kitchen levobunolol (BETAGAN) 0.5 % ophthalmic solution   Both Eyes   Place 1 drop into both eyes every morning.         . loratadine (CLARITIN) 10 MG tablet   Oral   Take 10 mg by mouth daily.         . rosuvastatin (CRESTOR) 20 MG tablet   Oral   Take 20 mg by mouth daily.          BP 141/76  Pulse 56  Temp(Src) 98 F (36.7 C) (Oral)   Resp 24  Wt 170 lb (77.111 kg)  SpO2 97% Physical Exam  Nursing note and vitals reviewed. Constitutional: He appears well-developed and well-nourished.  HENT:  Head: Normocephalic and atraumatic.  MMM Mild swelling uvula No trismus, uvular deviation, unilateral posterior pharyngeal edema or submandibular swelling.   Eyes: Conjunctivae are normal. Right eye exhibits no discharge. Left eye exhibits no discharge.  Neck: Normal range of motion. Neck supple. No tracheal deviation present.  Cardiovascular: Normal rate and regular rhythm.   Pulmonary/Chest: Effort normal. He has rales (few bilateral bases).  Abdominal: Soft. He exhibits no distension. There is no tenderness. There is no guarding.  Musculoskeletal: He exhibits edema.  Mild swelling feet and hands Mild erythematous hue to hands, good cap refill  Neurological: He is alert. GCS eye subscore is 4. GCS verbal subscore is 5. GCS motor subscore is 6.  Mild sedate but easily arousable to verbal Supple neck Moves all ext equal bilateral Perrl, horiz eomfi  Skin: Skin is warm. Rash noted.  Psychiatric: He has a normal mood and affect.    ED Course  Procedures (including critical care time) Labs Review Labs Reviewed  CBC WITH DIFFERENTIAL - Abnormal; Notable for the following:    WBC 10.9 (*)    RBC 4.01 (*)    HCT 38.9 (*)    Platelets 131 (*)    Neutrophils Relative % 87 (*)    Neutro Abs 9.6 (*)    Lymphocytes Relative 5 (*)    Lymphs Abs 0.5 (*)    All other components within normal limits  COMPREHENSIVE METABOLIC PANEL - Abnormal; Notable for the following:    Glucose, Bld 106 (*)    AST 40 (*)    GFR calc non Af Amer 67 (*)    GFR calc Af Amer 78 (*)    All other components within normal limits  URINALYSIS, ROUTINE W REFLEX MICROSCOPIC   Imaging Review No results found.  EKG Interpretation   None       MDM  No diagnosis found. Clinically allergic rx however pt sedate (likely from benadryl), general  weakness and recent hallucinations. Plan for screening labs to check anemia/ lytes/ kidney fx.  Pt had benadryl prior to arrival and zantac. No hx of allergies.  No acute angioedema on exam.  Steroids po in ED.  Signed out to recheck once more alert and benadryl has worn off.   Family prefers to take patient home if at all possible.    Facial swelling, Cough       Mariea Clonts, MD 02/22/13 [phone]

## 2013-06-21 ENCOUNTER — Inpatient Hospital Stay (HOSPITAL_COMMUNITY): Payer: Medicare Other

## 2013-06-21 ENCOUNTER — Inpatient Hospital Stay (HOSPITAL_COMMUNITY)
Admission: EM | Admit: 2013-06-21 | Discharge: 2013-06-28 | DRG: 689 | Disposition: A | Discharge status: Skilled Nursing Facility | Payer: Medicare Other | Attending: Internal Medicine | Admitting: Internal Medicine

## 2013-06-21 ENCOUNTER — Encounter (HOSPITAL_COMMUNITY): Payer: Self-pay | Admitting: Emergency Medicine

## 2013-06-21 DIAGNOSIS — N401 Enlarged prostate with lower urinary tract symptoms: Secondary | ICD-10-CM | POA: Diagnosis present

## 2013-06-21 DIAGNOSIS — Z87891 Personal history of nicotine dependence: Secondary | ICD-10-CM

## 2013-06-21 DIAGNOSIS — D649 Anemia, unspecified: Secondary | ICD-10-CM | POA: Diagnosis present

## 2013-06-21 DIAGNOSIS — E785 Hyperlipidemia, unspecified: Secondary | ICD-10-CM | POA: Diagnosis present

## 2013-06-21 DIAGNOSIS — N138 Other obstructive and reflux uropathy: Secondary | ICD-10-CM | POA: Diagnosis present

## 2013-06-21 DIAGNOSIS — R0989 Other specified symptoms and signs involving the circulatory and respiratory systems: Secondary | ICD-10-CM | POA: Diagnosis present

## 2013-06-21 DIAGNOSIS — N179 Acute kidney failure, unspecified: Secondary | ICD-10-CM

## 2013-06-21 DIAGNOSIS — Z79899 Other long term (current) drug therapy: Secondary | ICD-10-CM

## 2013-06-21 DIAGNOSIS — R339 Retention of urine, unspecified: Secondary | ICD-10-CM | POA: Diagnosis present

## 2013-06-21 DIAGNOSIS — H9190 Unspecified hearing loss, unspecified ear: Secondary | ICD-10-CM | POA: Diagnosis present

## 2013-06-21 DIAGNOSIS — N3090 Cystitis, unspecified without hematuria: Principal | ICD-10-CM | POA: Diagnosis present

## 2013-06-21 DIAGNOSIS — C61 Malignant neoplasm of prostate: Secondary | ICD-10-CM | POA: Diagnosis present

## 2013-06-21 DIAGNOSIS — Z7982 Long term (current) use of aspirin: Secondary | ICD-10-CM

## 2013-06-21 DIAGNOSIS — N390 Urinary tract infection, site not specified: Secondary | ICD-10-CM

## 2013-06-21 DIAGNOSIS — I2510 Atherosclerotic heart disease of native coronary artery without angina pectoris: Secondary | ICD-10-CM

## 2013-06-21 DIAGNOSIS — R338 Other retention of urine: Secondary | ICD-10-CM

## 2013-06-21 DIAGNOSIS — E860 Dehydration: Secondary | ICD-10-CM | POA: Diagnosis present

## 2013-06-21 DIAGNOSIS — H409 Unspecified glaucoma: Secondary | ICD-10-CM | POA: Diagnosis present

## 2013-06-21 DIAGNOSIS — G9341 Metabolic encephalopathy: Secondary | ICD-10-CM | POA: Diagnosis present

## 2013-06-21 DIAGNOSIS — E876 Hypokalemia: Secondary | ICD-10-CM | POA: Diagnosis present

## 2013-06-21 DIAGNOSIS — Z66 Do not resuscitate: Secondary | ICD-10-CM | POA: Diagnosis present

## 2013-06-21 DIAGNOSIS — Z8546 Personal history of malignant neoplasm of prostate: Secondary | ICD-10-CM

## 2013-06-21 DIAGNOSIS — B372 Candidiasis of skin and nail: Secondary | ICD-10-CM | POA: Diagnosis present

## 2013-06-21 DIAGNOSIS — I10 Essential (primary) hypertension: Secondary | ICD-10-CM

## 2013-06-21 LAB — BASIC METABOLIC PANEL
BUN: 38 mg/dL — AB (ref 6–23)
CO2: 24 mEq/L (ref 19–32)
CREATININE: 2.02 mg/dL — AB (ref 0.50–1.35)
Calcium: 9.2 mg/dL (ref 8.4–10.5)
Chloride: 101 mEq/L (ref 96–112)
GFR, EST AFRICAN AMERICAN: 30 mL/min — AB (ref >90)
GFR, EST NON AFRICAN AMERICAN: 26 mL/min — AB (ref >90)
GLUCOSE: 105 mg/dL — AB (ref 70–99)
Potassium: 4.2 mEq/L (ref 3.7–5.3)
Sodium: 141 mEq/L (ref 137–147)

## 2013-06-21 LAB — CBC WITH DIFFERENTIAL/PLATELET
BASOS PCT: 0 % (ref 0–1)
Basophils Absolute: 0 10*3/uL (ref 0.0–0.1)
EOS ABS: 0 10*3/uL (ref 0.0–0.7)
EOS PCT: 0 % (ref 0–5)
HEMATOCRIT: 35.3 % — AB (ref 39.0–52.0)
HEMOGLOBIN: 11.8 g/dL — AB (ref 13.0–17.0)
LYMPHS ABS: 0.9 10*3/uL (ref 0.7–4.0)
Lymphocytes Relative: 11 % — ABNORMAL LOW (ref 12–46)
MCH: 31.6 pg (ref 26.0–34.0)
MCHC: 33.4 g/dL (ref 30.0–36.0)
MCV: 94.4 fL (ref 78.0–100.0)
MONO ABS: 1.2 10*3/uL — AB (ref 0.1–1.0)
MONOS PCT: 15 % — AB (ref 3–12)
Neutro Abs: 5.9 10*3/uL (ref 1.7–7.7)
Neutrophils Relative %: 74 % (ref 43–77)
Platelets: 179 10*3/uL (ref 150–400)
RBC: 3.74 MIL/uL — ABNORMAL LOW (ref 4.22–5.81)
RDW: 13.8 % (ref 11.5–15.5)
WBC: 7.9 10*3/uL (ref 4.0–10.5)

## 2013-06-21 LAB — URINE MICROSCOPIC-ADD ON

## 2013-06-21 LAB — URINALYSIS, ROUTINE W REFLEX MICROSCOPIC
BILIRUBIN URINE: NEGATIVE
GLUCOSE, UA: NEGATIVE mg/dL
KETONES UR: NEGATIVE mg/dL
NITRITE: NEGATIVE
PH: 6 (ref 5.0–8.0)
Protein, ur: 100 mg/dL — AB
Specific Gravity, Urine: 1.013 (ref 1.005–1.030)
Urobilinogen, UA: 0.2 mg/dL (ref 0.0–1.0)

## 2013-06-21 MED ORDER — TAMSULOSIN HCL 0.4 MG PO CAPS
0.4000 mg | ORAL_CAPSULE | Freq: Every day | ORAL | Status: DC
  Administered 2013-06-21 – 2013-06-26 (×6): 0.4 mg via ORAL
  Filled 2013-06-21 (×8): qty 1

## 2013-06-21 MED ORDER — CEFTRIAXONE SODIUM 1 G IJ SOLR
1.0000 g | INTRAMUSCULAR | Status: DC
  Filled 2013-06-21: qty 10

## 2013-06-21 MED ORDER — CEFTRIAXONE SODIUM 1 G IJ SOLR
1.0000 g | Freq: Once | INTRAMUSCULAR | Status: AC
  Administered 2013-06-21: 1 g via INTRAVENOUS
  Filled 2013-06-21: qty 10

## 2013-06-21 MED ORDER — ONDANSETRON HCL 4 MG/2ML IJ SOLN: 4.0000 mg | Freq: Four times a day (QID) | INTRAMUSCULAR | Status: DC | PRN

## 2013-06-21 MED ORDER — LATANOPROST 0.005 % OP SOLN
1.0000 [drp] | Freq: Every day | OPHTHALMIC | Status: DC
  Administered 2013-06-21 – 2013-06-27 (×7): 1 [drp] via OPHTHALMIC
  Filled 2013-06-21: qty 2.5

## 2013-06-21 MED ORDER — SODIUM CHLORIDE 0.9 % IV SOLN
INTRAVENOUS | Status: DC
  Administered 2013-06-21: 16:00:00 via INTRAVENOUS
  Administered 2013-06-22: 100 mL/h via INTRAVENOUS
  Administered 2013-06-22 – 2013-06-27 (×4): via INTRAVENOUS

## 2013-06-21 MED ORDER — DEXTROSE 5 % IV SOLN
1.0000 g | INTRAVENOUS | Status: DC
  Administered 2013-06-22 – 2013-06-28 (×7): 1 g via INTRAVENOUS
  Filled 2013-06-21 (×7): qty 10

## 2013-06-21 MED ORDER — BISOPROLOL FUMARATE 5 MG PO TABS
5.0000 mg | ORAL_TABLET | Freq: Every day | ORAL | Status: DC
  Administered 2013-06-21 – 2013-06-28 (×6): 5 mg via ORAL
  Filled 2013-06-21 (×8): qty 1

## 2013-06-21 MED ORDER — ISOSORBIDE MONONITRATE ER 60 MG PO TB24
60.0000 mg | ORAL_TABLET | Freq: Every day | ORAL | Status: DC
  Administered 2013-06-21 – 2013-06-28 (×6): 60 mg via ORAL
  Filled 2013-06-21 (×8): qty 1

## 2013-06-21 MED ORDER — SODIUM CHLORIDE 0.9 % IV BOLUS (SEPSIS)
1000.0000 mL | Freq: Once | INTRAVENOUS | Status: AC
  Administered 2013-06-21: 1000 mL via INTRAVENOUS

## 2013-06-21 MED ORDER — BRIMONIDINE TARTRATE 0.2 % OP SOLN
1.0000 [drp] | Freq: Two times a day (BID) | OPHTHALMIC | Status: DC
  Administered 2013-06-21 – 2013-06-28 (×14): 1 [drp] via OPHTHALMIC
  Filled 2013-06-21: qty 5

## 2013-06-21 MED ORDER — ENOXAPARIN SODIUM 30 MG/0.3ML ~~LOC~~ SOLN
30.0000 mg | SUBCUTANEOUS | Status: DC
  Administered 2013-06-21: 30 mg via SUBCUTANEOUS
  Filled 2013-06-21 (×2): qty 0.3

## 2013-06-21 MED ORDER — LEVOBUNOLOL HCL 0.5 % OP SOLN
1.0000 [drp] | Freq: Every morning | OPHTHALMIC | Status: DC
  Administered 2013-06-22 – 2013-06-28 (×7): 1 [drp] via OPHTHALMIC
  Filled 2013-06-21: qty 5

## 2013-06-21 MED ORDER — ASPIRIN 81 MG PO CHEW
81.0000 mg | CHEWABLE_TABLET | Freq: Every day | ORAL | Status: DC
  Administered 2013-06-21 – 2013-06-28 (×7): 81 mg via ORAL
  Filled 2013-06-21 (×8): qty 1

## 2013-06-21 MED ORDER — ATORVASTATIN CALCIUM 20 MG PO TABS
20.0000 mg | ORAL_TABLET | Freq: Every day | ORAL | Status: DC
  Administered 2013-06-21 – 2013-06-26 (×6): 20 mg via ORAL
  Filled 2013-06-21 (×8): qty 1

## 2013-06-21 NOTE — ED Notes (Addendum)
Pt being sent by PCP, [facility], for urinary retention x 2-3 days.  PCP's office reports Pt attempted to provide a sample, but "only one drop came out and it looked like puss."  Hx of prostate ca.

## 2013-06-21 NOTE — H&P (Signed)
[facility] History and Physical  Jackson Diaz IDP:[id number] DOB: January 26, 1915 DOA: 06/21/2013  Referring physician: EDP PCP: Tawanna Solo, MD   Chief Complaint: urinary retention  HPI: Jackson Diaz is a 80 y.o. male with PMH of HTN, CAD, remote h/o BPH vs Prostate CA( Pt and family unsure of this, no workup was done) was in his usual state of health till about 3 days ago when he started having problems with urinary retention and being able to urinate in dribbles only. He has been having lower pelvic discomfort and his daughter also noticed mild confusion. Cogntively at baseline he is appropriate except for occasional lapses in short term memory and forgetfulness. Today he went to his PCP's office and was only able to dribble some drops of urine which was abnormal appearing per report, then sent to ER. In Er, foley placed 200cc foul smelling urine obtained Noted to have UTI, AKI   Review of Systems:  Constitutional:  No weight loss, night sweats, Fevers, chills, fatigue.  HEENT:  No headaches, Difficulty swallowing,Tooth/dental problems,Sore throat,  No sneezing, itching, ear ache, nasal congestion, post nasal drip,  Cardio-vascular:  No chest pain, Orthopnea, PND, swelling in lower extremities, anasarca, dizziness, palpitations  GI:  No heartburn, indigestion, abdominal pain, nausea, vomiting, diarrhea, change in bowel habits, loss of appetite  Resp:  No shortness of breath with exertion or at rest. No excess mucus, no productive cough, No non-productive cough, No coughing up of blood.No change in color of mucus.No wheezing.No chest wall deformity  Skin:  no rash or lesions.  GU:  no dysuria, change in color of urine, no urgency or frequency. No flank pain.  Musculoskeletal:  No joint pain or swelling. No decreased range of motion. No back pain.  Psych:  No change in mood or affect. No depression or anxiety. No memory loss.   Past Medical History  Diagnosis Date    . Hyperlipidemia   . CAD (coronary artery disease)   . Prostate cancer   . Cystadenoma   . Diverticulosis   . Hypertension   . Glaucoma   . Arthritis   . Seasonal allergies   . HOH (hard of hearing)   . Wears hearing aid    Past Surgical History  Procedure Laterality Date  . Eye surgery    . Transurethral resection of prostate    . Prostate surgery    . Hernia repair Right 10/12  . Hernia repair  [phone]  . Hear loss    . Back surgery  1970  . Inguinal hernia repair Right 06/18/2012    Procedure: REPAIR RECURRENT RIGHT INGUINALHERNIA WITH MESH;  Surgeon: Harl Bowie, MD;  Location: [place];  Service: General;  Laterality: Right;  . Insertion of mesh Right 06/18/2012    Procedure: INSERTION OF MESH;  Surgeon: Harl Bowie, MD;  Location: [place];  Service: General;  Laterality: Right;   Social History:  reports that he quit smoking about 41 years ago. He has never used smokeless tobacco. He reports that he drinks alcohol. He reports that he does not use illicit drugs.  Allergies  Allergen Reactions  . No Known Allergies     History reviewed. No pertinent family history.   Prior to Admission medications   Medication Sig Start Date End Date Taking? Authorizing Provider  aspirin 81 MG tablet Take 81 mg by mouth daily.   Yes Historical Provider, MD  bisoprolol (ZEBETA) 5 MG tablet Take 5 mg  by mouth daily.   Yes Historical Provider, MD  brimonidine (ALPHAGAN) 0.2 % ophthalmic solution Place 1 drop into both eyes 2 (two) times daily between meals as needed.   Yes Historical Provider, MD  isosorbide mononitrate (IMDUR) 60 MG 24 hr tablet Take 60 mg by mouth daily.   Yes Historical Provider, MD  latanoprost (XALATAN) 0.005 % ophthalmic solution 1 drop at bedtime.   Yes Historical Provider, MD  levobunolol (BETAGAN) 0.5 % ophthalmic solution Place 1 drop into both eyes every morning.   Yes Historical Provider, MD  rosuvastatin  (CRESTOR) 20 MG tablet Take 10 mg by mouth daily.    Yes Historical Provider, MD   Physical Exam: Filed Vitals:   06/21/13 1426  BP: 151/71  Pulse: 65  Temp: 98.1 F (36.7 C)  Resp: 18    BP 151/71  Pulse 65  Temp(Src) 98.1 F (36.7 C) (Oral)  Resp 18  SpO2 97%  General:  Appears calm and comfortable, sleepy, arouses and answers questions Eyes: PERRL, normal lids, irises & conjunctiva ENT: grossly normal hearing, lips & tongue Neck: no LAD, masses or thyromegaly Cardiovascular: RRR, no m/r/g. No LE edema. Respiratory: CTA bilaterally, no w/r/r. Normal respiratory effort. Abdomen: soft, ntnd Skin: no rash or induration seen on limited exam Musculoskeletal: 2 plus ankle edema Psychiatric: grossly normal mood and affect, speech fluent and appropriate Neurologic: grossly non-focal, moves all extremities          Labs on Admission:  Basic Metabolic Panel:  Recent Labs Lab 06/21/13 1050  NA 141  K 4.2  CL 101  CO2 24  GLUCOSE 105*  BUN 38*  CREATININE 2.02*  CALCIUM 9.2   Liver Function Tests: No results found for this basename: AST, ALT, ALKPHOS, BILITOT, PROT, ALBUMIN,  in the last 168 hours No results found for this basename: LIPASE, AMYLASE,  in the last 168 hours No results found for this basename: AMMONIA,  in the last 168 hours CBC:  Recent Labs Lab 06/21/13 1050  WBC 7.9  NEUTROABS 5.9  HGB 11.8*  HCT 35.3*  MCV 94.4  PLT 179   Cardiac Enzymes: No results found for this basename: CKTOTAL, CKMB, CKMBINDEX, TROPONINI,  in the last 168 hours  BNP (last 3 results) No results found for this basename: PROBNP,  in the last 8760 hours CBG: No results found for this basename: GLUCAP,  in the last 168 hours  Radiological Exams on Admission: No results found.   Assessment/Plan      Urinary tract infection -IV ceftriaxone -FU cultures    Metabolic encephalopathy -likely due to 1 -monitor with treatment, exam non focal    AKI (acute kidney  injury) -likely due to post obstructive AKI -check CT abd pelvis without IV contrast to r/o obstructing lesions -now with foley -start Flomax -needs voiding trial in 1-2days if improving    HTN (hypertension) -continue bisoprolol    CAD (coronary artery disease) -no complaints -resume ASA/BB/statin    DVT proph: lovenox  Code Status: DNR Family Communication: d/w son in law at bedside Disposition Plan:inpatient   Time spent: 31min  Francisca Harbuck Laythan [facility] Pager [phone]

## 2013-06-21 NOTE — ED Provider Notes (Signed)
CSN: [id number]     Arrival date & time 06/21/13  8676 History   First MD Initiated Contact with Patient 06/21/13 1008     Chief Complaint  Patient presents with  . Urinary Retention     (Consider location/radiation/quality/duration/timing/severity/associated sxs/prior Treatment) HPI  This is a 80 year old male with history of coronary artery disease, prostate cancer who presents with urinary urgency and retention.  Patient's daughter provides the history. She states over the last 2-3 days he's had increasing urinary urgency and painful urination. She also reports retention. He is only able to "dribble pee."  No known history of dementia but daughter has noticed increasing confusion over the last 2 days. No known fevers. Patient was seen by his primary care physician earlier today and they were unable to obtain a formal urine sample; however, purulence was noted with what was obtained.    Of note, patient's daughter is power of attorney. He is DO NOT RESUSCITATE, DO NOT INTUBATE.  Past Medical History  Diagnosis Date  . Hyperlipidemia   . CAD (coronary artery disease)   . Prostate cancer   . Cystadenoma   . Diverticulosis   . Hypertension   . Glaucoma   . Arthritis   . Seasonal allergies   . HOH (hard of hearing)   . Wears hearing aid    Past Surgical History  Procedure Laterality Date  . Eye surgery    . Transurethral resection of prostate    . Prostate surgery    . Hernia repair Right 10/12  . Hernia repair  [phone]  . Hear loss    . Back surgery  1970  . Inguinal hernia repair Right 06/18/2012    Procedure: REPAIR RECURRENT RIGHT INGUINALHERNIA WITH MESH;  Surgeon: Harl Bowie, MD;  Location: [place];  Service: General;  Laterality: Right;  . Insertion of mesh Right 06/18/2012    Procedure: INSERTION OF MESH;  Surgeon: Harl Bowie, MD;  Location: [place];  Service: General;  Laterality: Right;   History reviewed. No  pertinent family history. History  Substance Use Topics  . Smoking status: Former Smoker    Quit date: 06/11/1972  . Smokeless tobacco: Never Used  . Alcohol Use: Yes     Comment: wine nightly    Review of Systems  Constitutional: Negative.  Negative for fever.  Respiratory: Negative.  Negative for chest tightness and shortness of breath.   Cardiovascular: Negative.  Negative for chest pain.  Gastrointestinal: Negative.  Negative for nausea, vomiting, abdominal pain and diarrhea.  Genitourinary: Positive for dysuria, urgency and frequency. Negative for hematuria, flank pain, penile swelling, scrotal swelling and testicular pain.  Skin: Negative for rash.  Neurological: Negative for headaches.  Psychiatric/Behavioral: Positive for confusion.  All other systems reviewed and are negative.     Allergies  No known allergies  Home Medications   Prior to Admission medications   Medication Sig Start Date End Date Taking? Authorizing Provider  acetaminophen (TYLENOL) 325 MG tablet Take 650 mg by mouth every 6 (six) hours as needed for pain.    Historical Provider, MD  aspirin 81 MG tablet Take 81 mg by mouth daily.    Historical Provider, MD  bisoprolol (ZEBETA) 5 MG tablet Take 5 mg by mouth daily.    Historical Provider, MD  brimonidine (ALPHAGAN) 0.2 % ophthalmic solution Place 1 drop into both eyes 2 (two) times daily between meals as needed.    Historical Provider, MD  ezetimibe (ZETIA)  10 MG tablet Take 10 mg by mouth daily.    Historical Provider, MD  isosorbide mononitrate (IMDUR) 60 MG 24 hr tablet Take 60 mg by mouth daily.    Historical Provider, MD  latanoprost (XALATAN) 0.005 % ophthalmic solution 1 drop at bedtime.    Historical Provider, MD  levobunolol (BETAGAN) 0.5 % ophthalmic solution Place 1 drop into both eyes every morning.    Historical Provider, MD  predniSONE (DELTASONE) 50 MG tablet Take 1 tablet by mouth daily for the next 4 days. 02/22/13   Blanchard Kelch,  MD  rosuvastatin (CRESTOR) 20 MG tablet Take 20 mg by mouth daily.    Historical Provider, MD   BP 151/71  Pulse 65  Temp(Src) 98.1 F (36.7 C) (Oral)  Resp 18  SpO2 97% Physical Exam  Nursing note and vitals reviewed. Constitutional: He is oriented to person, place, and time. No distress.  elderly  HENT:  Head: Normocephalic and atraumatic.  Mm dry  Eyes: Pupils are equal, round, and reactive to light.  Cardiovascular: Normal rate and normal heart sounds.   No murmur heard. Irregular rate  Pulmonary/Chest: Effort normal and breath sounds normal. No respiratory distress. He has no wheezes.  Abdominal: Soft. Bowel sounds are normal. There is tenderness. There is no rebound.  Suprapubic tenderness to palpation without rebound or guarding  Genitourinary:  Circumcised penis, no crepitus noted in the perineum or scrotum  Musculoskeletal: He exhibits no edema.  Lymphadenopathy:    He has no cervical adenopathy.  Neurological: He is alert and oriented to person, place, and time.  Skin: Skin is warm and dry.  Candidal yeast infection noted in the bilateral groin  Psychiatric: He has a normal mood and affect.    ED Course  Procedures (including critical care time) Labs Review Labs Reviewed  URINALYSIS, ROUTINE W REFLEX MICROSCOPIC - Abnormal; Notable for the following:    APPearance TURBID (*)    Hgb urine dipstick LARGE (*)    Protein, ur 100 (*)    Leukocytes, UA LARGE (*)    All other components within normal limits  CBC WITH DIFFERENTIAL - Abnormal; Notable for the following:    RBC 3.74 (*)    Hemoglobin 11.8 (*)    HCT 35.3 (*)    Lymphocytes Relative 11 (*)    Monocytes Relative 15 (*)    Monocytes Absolute 1.2 (*)    All other components within normal limits  BASIC METABOLIC PANEL - Abnormal; Notable for the following:    Glucose, Bld 105 (*)    BUN 38 (*)    Creatinine, Ser 2.02 (*)    GFR calc non Af Amer 26 (*)    GFR calc Af Amer 30 (*)    All other  components within normal limits  URINE MICROSCOPIC-ADD ON - Abnormal; Notable for the following:    Bacteria, UA MANY (*)    All other components within normal limits  URINE CULTURE    Imaging Review No results found.   EKG Interpretation   Date/Time:  Friday Jun 21 2013 10:22:41 EDT Ventricular Rate:  73 PR Interval:  170 QRS Duration: 145 QT Interval:  456 QTC Calculation: 502 R Axis:   -76 Text Interpretation:  Sinus or ectopic atrial rhythm Supraventricular  bigeminy RBBB and LAFB No prior for comparison Confirmed by HORTON  MD,  COURTNEY ([id number]) on 06/21/2013 10:47:02 AM      MDM   Final diagnoses:  Urinary tract infection  AKI (acute kidney  injury)    Patient presents with urinary frequency and urgency as well as retention. He is nontoxic on exam. He does have tenderness to palpation of the suprapubic region. No fevers.  Urinalysis with too many to count white cells. Patient also has evidence of AKI.    WIll admit.    Merryl Hacker, MD 06/21/13 [phone]

## 2013-06-21 NOTE — Progress Notes (Signed)
Utilization Review completed.  Isis Costanza RN CM  

## 2013-06-22 DIAGNOSIS — R338 Other retention of urine: Secondary | ICD-10-CM

## 2013-06-22 LAB — COMPREHENSIVE METABOLIC PANEL
ALBUMIN: 2.5 g/dL — AB (ref 3.5–5.2)
ALT: 27 U/L (ref 0–53)
AST: 53 U/L — ABNORMAL HIGH (ref 0–37)
Alkaline Phosphatase: 54 U/L (ref 39–117)
BUN: 31 mg/dL — ABNORMAL HIGH (ref 6–23)
CO2: 24 mEq/L (ref 19–32)
Calcium: 8.5 mg/dL (ref 8.4–10.5)
Chloride: 107 mEq/L (ref 96–112)
Creatinine, Ser: 1.15 mg/dL (ref 0.50–1.35)
GFR calc Af Amer: 60 mL/min — ABNORMAL LOW (ref >90)
GFR calc non Af Amer: 51 mL/min — ABNORMAL LOW (ref >90)
Glucose, Bld: 97 mg/dL (ref 70–99)
POTASSIUM: 3.5 meq/L — AB (ref 3.7–5.3)
SODIUM: 140 meq/L (ref 137–147)
Total Bilirubin: 0.5 mg/dL (ref 0.3–1.2)
Total Protein: 5.2 g/dL — ABNORMAL LOW (ref 6.0–8.3)

## 2013-06-22 LAB — CBC
HCT: 29.7 % — ABNORMAL LOW (ref 39.0–52.0)
Hemoglobin: 9.9 g/dL — ABNORMAL LOW (ref 13.0–17.0)
MCH: 31.4 pg (ref 26.0–34.0)
MCHC: 33.3 g/dL (ref 30.0–36.0)
MCV: 94.3 fL (ref 78.0–100.0)
Platelets: 155 10*3/uL (ref 150–400)
RBC: 3.15 MIL/uL — AB (ref 4.22–5.81)
RDW: 13.9 % (ref 11.5–15.5)
WBC: 6.2 10*3/uL (ref 4.0–10.5)

## 2013-06-22 MED ORDER — ENOXAPARIN SODIUM 40 MG/0.4ML ~~LOC~~ SOLN
40.0000 mg | SUBCUTANEOUS | Status: DC
  Administered 2013-06-22 – 2013-06-27 (×6): 40 mg via SUBCUTANEOUS
  Filled 2013-06-22 (×7): qty 0.4

## 2013-06-22 NOTE — Progress Notes (Signed)
[facility] PROGRESS NOTE  Jackson Diaz ZTI:[id number] DOB: [birth year] DOA: 06/21/2013 PCP: Tawanna Solo, MD  Assessment/Plan: UTI -Continue rocephin pending cx data.  Acute Urinary Retention -Now with foley. -Will attempt voiding trial in the am.  ARF -Resolved. -Likely related to UTI/retention and mild dehydration. -CT pending.  Acute Metabolic Encephalopathy -2/2 UTI. -Per daughter back at baseline.  HTN -Well controlled. -Continue current regimen.  CAD -Stable. -No CP.  Code Status: DNR Family Communication: Daughter at bedside updated on plan of care.  Disposition Plan: Home when ready; likely 24-48 hours. Will request PT/OT evals.   Consultants:  None   Antibiotics:  Rocephin   Subjective: No complaints.  Objective: Filed Vitals:   06/21/13 1330 06/21/13 1426 06/21/13 2115 06/22/13 0531  BP: 138/60 151/71 92/62 97/44   Pulse: 61 65 58 58  Temp:  98.1 F (36.7 C) 97.8 F (36.6 C) 98.3 F (36.8 C)  TempSrc:  Oral Oral Oral  Resp: 12 18 16 14   Height:  5\' 10"  (1.778 m)    Weight:  76.204 kg (168 lb)    SpO2: 99% 97% 95% 94%    Intake/Output Summary (Last 24 hours) at 06/22/13 1355 Last data filed at 06/22/13 1344  Gross per 24 hour  Intake 2228.33 ml  Output    826 ml  Net 1402.33 ml   Filed Weights   06/21/13 1426  Weight: 76.204 kg (168 lb)    Exam:   General:  AA Ox3  Cardiovascular: RRR  Respiratory: CTA B  Abdomen: S/NT/ND/+BS  Extremities: 1-2+ edema bilaterally   Neurologic:  Non-focal  Data Reviewed: Basic Metabolic Panel:  Recent Labs Lab 06/21/13 1050 06/22/13 0502  NA 141 140  K 4.2 3.5*  CL 101 107  CO2 24 24  GLUCOSE 105* 97  BUN 38* 31*  CREATININE 2.02* 1.15  CALCIUM 9.2 8.5   Liver Function Tests:  Recent Labs Lab 06/22/13 0502  AST 53*  ALT 27  ALKPHOS 54  BILITOT 0.5  PROT 5.2*  ALBUMIN 2.5*   No results found for this basename: LIPASE, AMYLASE,  in the last 168  hours No results found for this basename: AMMONIA,  in the last 168 hours CBC:  Recent Labs Lab 06/21/13 1050 06/22/13 0502  WBC 7.9 6.2  NEUTROABS 5.9  --   HGB 11.8* 9.9*  HCT 35.3* 29.7*  MCV 94.4 94.3  PLT 179 155   Cardiac Enzymes: No results found for this basename: CKTOTAL, CKMB, CKMBINDEX, TROPONINI,  in the last 168 hours BNP (last 3 results) No results found for this basename: PROBNP,  in the last 8760 hours CBG: No results found for this basename: GLUCAP,  in the last 168 hours  No results found for this or any previous visit (from the past 240 hour(s)).   Studies: Ct Abdomen Pelvis Wo Contrast  06/21/2013   CLINICAL DATA:  Acute kidney injury. UTI. Urinary retention. History of prostate carcinoma.  EXAM: CT ABDOMEN AND PELVIS WITHOUT CONTRAST  TECHNIQUE: Multidetector CT imaging of the abdomen and pelvis was performed following the standard protocol without IV contrast.  COMPARISON:  None.  FINDINGS: Kidneys are normal in size and position. No intrarenal stones. There is a low-density anterior mid pole left renal mass measuring 3 cm x 1.8 cm, likely a cyst. No other renal masses. There is mild dilation of portions of the right ureter and a right renal pelvis. No ureteral stone is seen. Left intrarenal collecting system and ureter are  unremarkable.  Bladder is mostly decompressed by a Foley catheter. The bladder wall is thickened. There is inflammatory type stranding in the peri-vesicular fat.  Prostate gland is significantly enlarged. It measures approximate 5.8 cm x 5.6 cm transversely. There is fullness of the right seminal vesicle.  There is a fat containing right inguinal hernia that also contains inflammatory type stranding. No bowel enters cysts.  There is a complex cystic mass that enlarged pancreatic head measuring 6.6 cm x 3.9 cm x 5.5 cm. There is irregularly dilated pancreatic duct distal to this containing some air, extending throughout the tail. A cystic pancreatic  neoplasm must be considered. Findings could be the sequela of prior bouts of pancreatitis with multiple cysts and dilated ducts.  Heart is moderately enlarged. There is dependent subsegmental atelectasis in the lung bases.  Liver, spleen and gallbladder are unremarkable. No bile duct dilation. No adrenal masses.  No pathologically enlarged lymph nodes. No ascites. Bowel is unremarkable.  Atherosclerotic calcifications are noted throughout the abdominal aorta and its branch vessels. Mild aortic ectasia.  There are advanced degenerative changes throughout the visualized spine. No osteoblastic or osteolytic lesions.  IMPRESSION: 1. Thickened bladder wall with perivesicular inflammatory type stranding consistent with a severe cystitis. 2. No other acute findings. 3. Complex cystic mass enlarges the pancreatic head. The chronicity of this is unclear since there are no prior exams. This could reflect a cystic neoplasm or be the consequence of previous episodes of pancreatitis. Followup contrast enhanced CT or, preferably, pancreatic MRI, is recommended. 4. Other chronic findings include cardiomegaly, a low-density left renal lesion, likely a cyst, atherosclerotic changes throughout the aorta and its branch vessels, prostatic enlargement, and advanced degenerative changes throughout the visualized spine.   Electronically Signed   By: Lajean Manes M.D.   On: 06/21/2013 20:43    Scheduled Meds: . aspirin  81 mg Oral Daily  . atorvastatin  20 mg Oral q1800  . bisoprolol  5 mg Oral Daily  . brimonidine  1 drop Both Eyes q12n4p  . cefTRIAXone (ROCEPHIN)  IV  1 g Intravenous Q24H  . enoxaparin (LOVENOX) injection  30 mg Subcutaneous Q24H  . isosorbide mononitrate  60 mg Oral Daily  . latanoprost  1 drop Both Eyes QHS  . levobunolol  1 drop Both Eyes q morning - 10a  . tamsulosin  0.4 mg Oral QPC supper   Continuous Infusions: . sodium chloride 100 mL/hr at 06/22/13 3154    Principal Problem:   UTI (urinary  tract infection) Active Problems:   Urinary tract infection   ARF (acute renal failure)   HTN (hypertension)   CAD (coronary artery disease)   Acute urinary retention    Time spent: 35 minutes. Greater than 50% of this time was spent in direct contact with the patient coordinating care.    [facility] Pager [phone]  If 7PM-7AM, please contact night-coverage at [email or web address], password [facility] 06/22/2013, 1:55 PM  LOS: 1 day

## 2013-06-22 NOTE — Evaluation (Signed)
Physical Therapy Evaluation Patient Details Name: Jackson Diaz MRN: [id number] DOB: 30-Jun-1914 Today's Date: 06/22/2013   History of Present Illness  80 yo male adm 06/21/13 with UTI; PMHx: HTN, CAD< DM  Clinical Impression  Pt will benefit from PT to address deficits below; Pt is household amb per dtr, sits at kitchen table a lot; has meals on wheels, dtr brings him lunch, has assist if needed in evenings; May benefit from HHPT at D/C if agreeable    Follow Up Recommendations Home health PT (pt is alone during day)    Equipment Recommendations  None recommended by PT    Recommendations for Other Services       Precautions / Restrictions Precautions Precautions: Fall      Mobility  Bed Mobility Overal bed mobility: Needs Assistance Bed Mobility: Supine to Sit     Supine to sit: Min assist;Mod assist     General bed mobility comments: incr time  Transfers Overall transfer level: Needs assistance Equipment used: Rolling walker (2 wheeled) Transfers: Sit to/from Stand Sit to Stand: Min assist         General transfer comment: cues for hand placement and wt shift  Ambulation/Gait Ambulation/Gait assistance: Min assist Ambulation Distance (Feet): 150 Feet Assistive device: Rolling walker (2 wheeled) Gait Pattern/deviations: Decreased stride length;Trunk flexed;Narrow base of support     General Gait Details: verbal cues for posture and RW position  Stairs            Wheelchair Mobility    Modified Rankin (Stroke Patients Only)       Balance Overall balance assessment: Needs assistance           Standing balance-Leahy Scale: Poor                               Pertinent Vitals/Pain No c/o pain    Home Living Family/patient expects to be discharged to:: Private residence Living Arrangements: Other relatives (grand-dtr)   Type of Home: House                Prior Function Level of Independence: Independent with  assistive device(s);Needs assistance   Gait / Transfers Assistance Needed: mobile meals 1x/day, family does lunch and pt heats  up oatmeal for breakfast           Hand Dominance        Extremity/Trunk Assessment               Lower Extremity Assessment: Generalized weakness         Communication   Communication: HOH  Cognition Arousal/Alertness: Awake/alert Behavior During Therapy: WFL for tasks assessed/performed Overall Cognitive Status: Within Functional Limits for tasks assessed                      General Comments General comments (skin integrity, edema, etc.): LE edema at baseline; dtr reports pt's legs are much better today    Exercises        Assessment/Plan    PT Assessment Patient needs continued PT services  PT Diagnosis Difficulty walking   PT Problem List Decreased strength;Decreased activity tolerance;Decreased balance;Decreased mobility  PT Treatment Interventions DME instruction;Gait training;Functional mobility training;Therapeutic activities;Therapeutic exercise;Patient/family education   PT Goals (Current goals can be found in the Care Plan section) Acute Rehab PT Goals Patient Stated Goal: be able to return home to grand-dtr's PT Goal Formulation: With patient Time For Goal Achievement: 06/29/13  Potential to Achieve Goals: Good    Frequency Min 3X/week   Barriers to discharge        Co-evaluation               End of Session Equipment Utilized During Treatment: Gait belt Activity Tolerance: Patient tolerated treatment well Patient left: in chair;with call bell/phone within reach Nurse Communication: Mobility status         Time: 1115-5208 PT Time Calculation (min): 25 min   Charges:   PT Evaluation $Initial PT Evaluation Tier I: 1 Procedure PT Treatments $Gait Training: 23-37 mins   PT G Codes:          Neil Crouch 06/22/2013, 3:23 PM

## 2013-06-23 LAB — BASIC METABOLIC PANEL
BUN: 22 mg/dL (ref 6–23)
CO2: 24 mEq/L (ref 19–32)
CREATININE: 0.81 mg/dL (ref 0.50–1.35)
Calcium: 8.6 mg/dL (ref 8.4–10.5)
Chloride: 106 mEq/L (ref 96–112)
GFR calc Af Amer: 84 mL/min — ABNORMAL LOW (ref >90)
GFR, EST NON AFRICAN AMERICAN: 72 mL/min — AB (ref >90)
GLUCOSE: 93 mg/dL (ref 70–99)
POTASSIUM: 3.8 meq/L (ref 3.7–5.3)
Sodium: 141 mEq/L (ref 137–147)

## 2013-06-23 LAB — CBC
HCT: 30.4 % — ABNORMAL LOW (ref 39.0–52.0)
HEMOGLOBIN: 10.3 g/dL — AB (ref 13.0–17.0)
MCH: 31.8 pg (ref 26.0–34.0)
MCHC: 33.9 g/dL (ref 30.0–36.0)
MCV: 93.8 fL (ref 78.0–100.0)
Platelets: 166 10*3/uL (ref 150–400)
RBC: 3.24 MIL/uL — ABNORMAL LOW (ref 4.22–5.81)
RDW: 13.7 % (ref 11.5–15.5)
WBC: 7.3 10*3/uL (ref 4.0–10.5)

## 2013-06-23 MED ORDER — IPRATROPIUM-ALBUTEROL 0.5-2.5 (3) MG/3ML IN SOLN
3.0000 mL | Freq: Two times a day (BID) | RESPIRATORY_TRACT | Status: DC
  Administered 2013-06-24 – 2013-06-27 (×8): 3 mL via RESPIRATORY_TRACT
  Filled 2013-06-23 (×8): qty 3

## 2013-06-23 MED ORDER — IPRATROPIUM-ALBUTEROL 0.5-2.5 (3) MG/3ML IN SOLN
3.0000 mL | RESPIRATORY_TRACT | Status: DC
  Administered 2013-06-23: 3 mL via RESPIRATORY_TRACT
  Filled 2013-06-23: qty 3

## 2013-06-23 MED ORDER — HALOPERIDOL LACTATE 5 MG/ML IJ SOLN
5.0000 mg | Freq: Once | INTRAMUSCULAR | Status: AC
  Administered 2013-06-23: 5 mg via INTRAVENOUS
  Filled 2013-06-23: qty 1

## 2013-06-23 MED ORDER — LORAZEPAM 2 MG/ML IJ SOLN: 0.5000 mg | Freq: Once | INTRAMUSCULAR | Status: DC

## 2013-06-23 NOTE — Progress Notes (Signed)
[facility] PROGRESS NOTE  Jackson Diaz HYW:[id number] DOB: [birth year] DOA: 06/21/2013 PCP: Tawanna Solo, MD  Assessment/Plan: UTI -Continue rocephin pending cx data. (>100,000 GPC).  Acute Urinary Retention -Currently undergoing a voiding trial. -If no spontaneous urination, may need to replace foley.  ARF -Resolved. -Likely related to UTI/retention and mild dehydration.  Acute Metabolic Encephalopathy -2/2 UTI. -Had some sundowning overnight. -He appears "fidgety" but is able to answer all questions appropriately and is currently oriented.  HTN -Well controlled. -Continue current regimen.  CAD -Stable. -No CP.  Code Status: DNR Family Communication: Spoke with daughter Lattie Haw at bedside 5/23. Disposition Plan: Home when ready; likely 24-48 hours. HHPT as per PT eval.   Consultants:  None   Antibiotics:  Rocephin   Subjective: No complaints.  Objective: Filed Vitals:   06/22/13 0531 06/22/13 1428 06/22/13 2223 06/23/13 0546  BP: 97/44 100/45 151/68 137/61  Pulse: 58 56 68 64  Temp: 98.3 F (36.8 C) 98 F (36.7 C) 97.6 F (36.4 C) 97.7 F (36.5 C)  TempSrc: Oral Oral Oral Oral  Resp: 14 16 16 18   Height:      Weight:      SpO2: 94% 96% 94% 92%    Intake/Output Summary (Last 24 hours) at 06/23/13 1239 Last data filed at 06/23/13 0752  Gross per 24 hour  Intake    960 ml  Output   2750 ml  Net  -1790 ml   Filed Weights   06/21/13 1426  Weight: 76.204 kg (168 lb)    Exam:   General:  AA Ox3  Cardiovascular: RRR  Respiratory: CTA B  Abdomen: S/NT/ND/+BS  Extremities: 1-2+ edema bilaterally   Neurologic:  Non-focal  Data Reviewed: Basic Metabolic Panel:  Recent Labs Lab 06/21/13 1050 06/22/13 0502 06/23/13 0540  NA 141 140 141  K 4.2 3.5* 3.8  CL 101 107 106  CO2 24 24 24   GLUCOSE 105* 97 93  BUN 38* 31* 22  CREATININE 2.02* 1.15 0.81  CALCIUM 9.2 8.5 8.6   Liver Function Tests:  Recent Labs Lab  06/22/13 0502  AST 53*  ALT 27  ALKPHOS 54  BILITOT 0.5  PROT 5.2*  ALBUMIN 2.5*   No results found for this basename: LIPASE, AMYLASE,  in the last 168 hours No results found for this basename: AMMONIA,  in the last 168 hours CBC:  Recent Labs Lab 06/21/13 1050 06/22/13 0502 06/23/13 0540  WBC 7.9 6.2 7.3  NEUTROABS 5.9  --   --   HGB 11.8* 9.9* 10.3*  HCT 35.3* 29.7* 30.4*  MCV 94.4 94.3 93.8  PLT 179 155 166   Cardiac Enzymes: No results found for this basename: CKTOTAL, CKMB, CKMBINDEX, TROPONINI,  in the last 168 hours BNP (last 3 results) No results found for this basename: PROBNP,  in the last 8760 hours CBG: No results found for this basename: GLUCAP,  in the last 168 hours  Recent Results (from the past 240 hour(s))  URINE CULTURE     Status: None   Collection Time    06/21/13 11:26 AM      Result Value Ref Range Status   Specimen Description URINE, CATHETERIZED   Final   Special Requests NONE   Final   Culture  Setup Time     Final   Value: 06/21/2013 23:38     Performed at [place]     Final   Value: >=100,000 COLONIES/ML  Performed at [facility]     Final   Value: [place]     Performed at [facility]   Report Status PENDING   Incomplete     Studies: Ct Abdomen Pelvis Wo Contrast  06/21/2013   CLINICAL DATA:  Acute kidney injury. UTI. Urinary retention. History of prostate carcinoma.  EXAM: CT ABDOMEN AND PELVIS WITHOUT CONTRAST  TECHNIQUE: Multidetector CT imaging of the abdomen and pelvis was performed following the standard protocol without IV contrast.  COMPARISON:  None.  FINDINGS: Kidneys are normal in size and position. No intrarenal stones. There is a low-density anterior mid pole left renal mass measuring 3 cm x 1.8 cm, likely a cyst. No other renal masses. There is mild dilation of portions of the right ureter and a right renal pelvis. No ureteral stone is seen. Left  intrarenal collecting system and ureter are unremarkable.  Bladder is mostly decompressed by a Foley catheter. The bladder wall is thickened. There is inflammatory type stranding in the peri-vesicular fat.  Prostate gland is significantly enlarged. It measures approximate 5.8 cm x 5.6 cm transversely. There is fullness of the right seminal vesicle.  There is a fat containing right inguinal hernia that also contains inflammatory type stranding. No bowel enters cysts.  There is a complex cystic mass that enlarged pancreatic head measuring 6.6 cm x 3.9 cm x 5.5 cm. There is irregularly dilated pancreatic duct distal to this containing some air, extending throughout the tail. A cystic pancreatic neoplasm must be considered. Findings could be the sequela of prior bouts of pancreatitis with multiple cysts and dilated ducts.  Heart is moderately enlarged. There is dependent subsegmental atelectasis in the lung bases.  Liver, spleen and gallbladder are unremarkable. No bile duct dilation. No adrenal masses.  No pathologically enlarged lymph nodes. No ascites. Bowel is unremarkable.  Atherosclerotic calcifications are noted throughout the abdominal aorta and its branch vessels. Mild aortic ectasia.  There are advanced degenerative changes throughout the visualized spine. No osteoblastic or osteolytic lesions.  IMPRESSION: 1. Thickened bladder wall with perivesicular inflammatory type stranding consistent with a severe cystitis. 2. No other acute findings. 3. Complex cystic mass enlarges the pancreatic head. The chronicity of this is unclear since there are no prior exams. This could reflect a cystic neoplasm or be the consequence of previous episodes of pancreatitis. Followup contrast enhanced CT or, preferably, pancreatic MRI, is recommended. 4. Other chronic findings include cardiomegaly, a low-density left renal lesion, likely a cyst, atherosclerotic changes throughout the aorta and its branch vessels, prostatic  enlargement, and advanced degenerative changes throughout the visualized spine.   Electronically Signed   By: Lajean Manes M.D.   On: 06/21/2013 20:43    Scheduled Meds: . aspirin  81 mg Oral Daily  . atorvastatin  20 mg Oral q1800  . bisoprolol  5 mg Oral Daily  . brimonidine  1 drop Both Eyes q12n4p  . cefTRIAXone (ROCEPHIN)  IV  1 g Intravenous Q24H  . enoxaparin (LOVENOX) injection  40 mg Subcutaneous Q24H  . isosorbide mononitrate  60 mg Oral Daily  . latanoprost  1 drop Both Eyes QHS  . levobunolol  1 drop Both Eyes q morning - 10a  . tamsulosin  0.4 mg Oral QPC supper   Continuous Infusions: . sodium chloride 100 mL/hr (06/22/13 1608)    Principal Problem:   UTI (urinary tract infection) Active Problems:   Urinary tract infection   ARF (acute renal failure)  HTN (hypertension)   CAD (coronary artery disease)   Acute urinary retention    Time spent: 35 minutes. Greater than 50% of this time was spent in direct contact with the patient coordinating care.    [facility] Pager [phone]  If 7PM-7AM, please contact night-coverage at [email or web address], password [email or web address] 06/23/2013, 12:39 PM  LOS: 2 days

## 2013-06-23 NOTE — Progress Notes (Signed)
Physical Therapy Treatment Patient Details Name: Jackson Diaz MRN: [id number] DOB: 08-20-1914 Today's Date: 06/23/2013    History of Present Illness 80 yo male adm 06/21/13 with UTI; PMHx: HTN, CAD< DM    PT Comments    Pt requiring much more assist today (+2 to +3 compared to min assist yesterday); Daughter present for session; pt wheezing and SOB with mobility this pm but states he feels fine; May need SNF if continues to regress with mobility; Pt also having visual hallucinations today  Follow Up Recommendations  Home health PT;Supervision/Assistance - 24 hour (if cont to need incr assist, may need SNF)     Equipment Recommendations  None recommended by PT    Recommendations for Other Services       Precautions / Restrictions Precautions Precautions: Fall Restrictions Weight Bearing Restrictions: No    Mobility  Bed Mobility Overal bed mobility: Needs Assistance Bed Mobility: Supine to Sit     Supine to sit: Max assist     General bed mobility comments: pt requiring incr assist today  Transfers Overall transfer level: Needs assistance Equipment used: Rolling walker (2 wheeled) Transfers: Sit to/from Stand Sit to Stand: +2 physical assistance;Max assist         General transfer comment: cues for hand placement and wt shift;   Ambulation/Gait Ambulation/Gait assistance: +2 physical assistance;Total assist Ambulation Distance (Feet): 8 Feet Assistive device: Rolling walker (2 wheeled) Gait Pattern/deviations: Staggering right;Leaning posteriorly;Trunk flexed     General Gait Details: pt requiring incr assist today; chair to pt to prevent fall   Stairs            Wheelchair Mobility    Modified Rankin (Stroke Patients Only)       Balance Overall balance assessment: Needs assistance         Standing balance support: Bilateral upper extremity supported Standing balance-Leahy Scale: Zero                      Cognition  Arousal/Alertness: Awake/alert Behavior During Therapy: WFL for tasks assessed/performed Overall Cognitive Status: Impaired/Different from baseline Area of Impairment: Following commands;Safety/judgement;Awareness;Problem solving       Following Commands: Follows one step commands inconsistently Safety/Judgement: Decreased awareness of safety;Decreased awareness of deficits   Problem Solving: Slow processing;Decreased initiation;Difficulty sequencing;Requires verbal cues;Requires tactile cues General Comments: pt having visula hallucinations, markedly different than yesterday    Exercises      General Comments        Pertinent Vitals/Pain     Home Living                      Prior Function            PT Goals (current goals can now be found in the care plan section) Acute Rehab PT Goals Patient Stated Goal: be able to return home to grand-dtr's Time For Goal Achievement: 06/29/13 Potential to Achieve Goals: Good Progress towards PT goals: Not progressing toward goals - comment    Frequency  Min 3X/week    PT Plan Current plan remains appropriate    Co-evaluation             End of Session Equipment Utilized During Treatment: Gait belt Activity Tolerance: Patient tolerated treatment well Patient left: in chair;with call bell/phone within reach;with family/visitor present;with nursing/sitter in room     Time: 0539-7673 PT Time Calculation (min): 17 min  Charges:  $Gait Training: 8-22 mins  G Codes:      Neil Crouch 06-30-2013, 4:14 PM

## 2013-06-24 LAB — URINE CULTURE: Colony Count: >=100000

## 2013-06-24 MED ORDER — ALBUTEROL SULFATE (2.5 MG/3ML) 0.083% IN NEBU
INHALATION_SOLUTION | RESPIRATORY_TRACT | Status: AC
  Filled 2013-06-24: qty 3

## 2013-06-24 MED ORDER — ALBUTEROL SULFATE (2.5 MG/3ML) 0.083% IN NEBU
2.5000 mg | INHALATION_SOLUTION | RESPIRATORY_TRACT | Status: DC | PRN
  Administered 2013-06-24: 2.5 mg via RESPIRATORY_TRACT

## 2013-06-24 NOTE — Progress Notes (Signed)
Clinical Social Work Department BRIEF PSYCHOSOCIAL ASSESSMENT 06/24/2013  Patient:  Jackson Diaz, Jackson Diaz     Account Number:  [id number]     Admit date:  06/21/2013  Clinical Social Worker:  Earlie Server  Date/Time:  06/24/2013 09:45 AM  Referred by:  Physician  Date Referred:  06/24/2013 Referred for  SNF Placement   Other Referral:   Interview type:  Family Other interview type:   Patient confused at this time and cannot fully participate in assessment.    PSYCHOSOCIAL DATA Living Status:  FAMILY Admitted from facility:   Level of care:   Primary support name:  Lattie Haw Primary support relationship to patient:  CHILD, ADULT Degree of support available:   Strong    CURRENT CONCERNS Current Concerns  Post-Acute Placement   Other Concerns:    SOCIAL WORK ASSESSMENT / PLAN CSW received referral in order to assist with DC planning. CSW went to room but patient unable to participate in assessment. No family was present so CSW contacted dtr via phone. CSW introduced myself and explained role.    Dtr reports that patient is currently living with her dtr. Patient is alone during the day but is able to make his own breakfast and has Mobile Meals for lunch. Patient has an aide that comes at least three times a week to assist with baths and cleaning. Patient's granddtr works as a professor but dtr feels that patient does well at home alone during the day. Dtr lives about 5 minutes away and assists as needed. Dtr reports she was recently diagnosed with cancer so she has not been as helpful as before but feels that family is supporting patient well. Dtr reports that patient is usually alert and oriented and feels that patient has only been confused in the hospital due to hospital delirium and side effects from taking Haldol. CSW spoke with dtr about SNF placement but dtr feels that SNF would not be beneficial at this time and that patient will do well at home with [facility] services. Patient used  [facility] [facility] around January 2015 and dtr feels this was helpful.    CSW provided dtr with contact information and encouraged dtr to contact CSW if she changed her mind re: SNF placement. CSW alerted CM of possible HH needs. CSW will continue to follow and will assist as needed.   Assessment/plan status:  Psychosocial Support/Ongoing Assessment of Needs Other assessment/ plan:   Information/referral to community resources:   SNF information    PATIENT'S/FAMILY'S RESPONSE TO PLAN OF CARE: Patient unable to fully participate in assessment. Dtr engaged and asked appropriate questions. Dtr feels that SNF placement would make patient more confused and feels it would be best to get him back into his own environment. Dtr has health issues as well but feels that with family support, patient will be safe and more comfortable at home. Dtr thanked CSW for all options but prefers HH at this time.       [place], [place] [phone]

## 2013-06-24 NOTE — Evaluation (Signed)
Occupational Therapy Evaluation Patient Details Name: Jackson Diaz MRN: [id number] DOB: 07-05-1914 Today's Date: 06/24/2013    History of Present Illness 80 yo male adm 06/21/13 with UTI; PMHx: HTN, CAD< DM   Clinical Impression   Pt presents to OT with problems listed below. Pt will benefit from skilled OT to increase I with ADL activity and return to PLOF    Follow Up Recommendations  SNF    Equipment Recommendations  None recommended by OT       Precautions / Restrictions Precautions Precautions: Fall Restrictions Weight Bearing Restrictions: No      Mobility Bed Mobility Overal bed mobility: Needs Assistance Bed Mobility: Supine to Sit     Supine to sit: Total assist     General bed mobility comments: Upon initiating sitting EOB pt then pushed back in to supine.    Transfers                      Balance                                            ADL Overall ADL's : Needs assistance/impaired                                       General ADL Comments: Pt resisted sitting EOB and then verbalize ' i am not going to sit up'  Pt returned to supine with total A.  Pt did not focus on OT during session.  Pt very fidgety.  Sitter also reported pt being fidgety.  Upon PT eval pt needed much less help. Pt will need increased care at home at this level     Vision                            Extremity/Trunk Assessment Upper Extremity Assessment Upper Extremity Assessment:  (BUE very stiff- pt seemed to resist ROM.)           Communication Communication Communication: HOH   Cognition   Behavior During Therapy: Agitated Overall Cognitive Status: No family/caregiver present to determine baseline cognitive functioning Area of Impairment: Orientation;Attention;Following commands;Safety/judgement;Awareness Orientation Level: Disoriented to             General Comments: pt did not follow commands  consistently.   General Comments               Home Living Family/patient expects to be discharged to:: Private residence Living Arrangements: Other relatives (grand-dtr)   Type of Home: House                                  Prior Functioning/Environment Level of Independence: Independent with assistive device(s);Needs assistance  Gait / Transfers Assistance Needed: mobile meals 1x/day, family does lunch and pt heats  up oatmeal for breakfast          OT Diagnosis: Generalized weakness;Cognitive deficits   OT Problem List: Decreased strength;Decreased activity tolerance;Decreased safety awareness;Decreased cognition   OT Treatment/Interventions: Self-care/ADL training;Patient/family education;DME and/or AE instruction    OT Goals(Current goals can be found in the care plan section) Acute Rehab OT Goals Patient Stated Goal: did not state Time For  Goal Achievement: 08/08/13  OT Frequency: Min 2X/week              End of Session Nurse Communication: Mobility status  Activity Tolerance: Treatment limited secondary to agitation Patient left: in bed;with nursing/sitter in room   Time: 1150-1213 OT Time Calculation (min): 23 min Charges:  OT General Charges $OT Visit: 1 Procedure OT Evaluation $Initial OT Evaluation Tier I: 1 Procedure OT Treatments $Self Care/Home Management : 8-22 mins G-Codes:    Betsy Pries Jun 30, 2013, 12:36 PM

## 2013-06-24 NOTE — Progress Notes (Signed)
[facility] PROGRESS NOTE  Jackson Diaz PJA:[id number] DOB: [birth year] DOA: 06/21/2013 PCP: Tawanna Solo, MD  Assessment/Plan: UTI -Cx with aerococcus. -Curbside consult with ID: can treat with amoxicillin or keflex. -Will leave on rocephin while in the hospital given degree of cystitis evidenced on CT scan.  Acute Urinary Retention -Failed voiding trial. -Foley replaced. -Will DC with foley an OP GU follow up.  ARF -Resolved. -Likely related to UTI/retention and mild dehydration.  Acute Metabolic Encephalopathy -2/2 UTI and probably hospital delirium. -No significant change from yesterday.  HTN -Well controlled. -Continue current regimen.  CAD -Stable. -No CP.  Code Status: DNR Family Communication: Spoke with daughter Lattie Haw via phone 5/24. Disposition Plan: Home when ready. Will need to consider SNF if mentation doesn't significant;y improve.   Consultants:  None   Antibiotics:  Rocephin   Subjective: No complaints.  Objective: Filed Vitals:   06/23/13 2126 06/23/13 2212 06/24/13 0602 06/24/13 0911  BP: 156/74  149/68   Pulse: 80  63   Temp:   98.8 F (37.1 C)   TempSrc:   Axillary   Resp: 40  20   Height:      Weight:      SpO2: 89% 97% 94% 98%    Intake/Output Summary (Last 24 hours) at 06/24/13 1236 Last data filed at 06/24/13 0500  Gross per 24 hour  Intake    720 ml  Output   1475 ml  Net   -755 ml   Filed Weights   06/21/13 1426  Weight: 76.204 kg (168 lb)    Exam:   General:  AA Ox3  Cardiovascular: RRR  Respiratory: CTA B  Abdomen: S/NT/ND/+BS  Extremities: 1-2+ edema bilaterally   Neurologic:  Non-focal  Data Reviewed: Basic Metabolic Panel:  Recent Labs Lab 06/21/13 1050 06/22/13 0502 06/23/13 0540  NA 141 140 141  K 4.2 3.5* 3.8  CL 101 107 106  CO2 24 24 24   GLUCOSE 105* 97 93  BUN 38* 31* 22  CREATININE 2.02* 1.15 0.81  CALCIUM 9.2 8.5 8.6   Liver Function Tests:  Recent  Labs Lab 06/22/13 0502  AST 53*  ALT 27  ALKPHOS 54  BILITOT 0.5  PROT 5.2*  ALBUMIN 2.5*   No results found for this basename: LIPASE, AMYLASE,  in the last 168 hours No results found for this basename: AMMONIA,  in the last 168 hours CBC:  Recent Labs Lab 06/21/13 1050 06/22/13 0502 06/23/13 0540  WBC 7.9 6.2 7.3  NEUTROABS 5.9  --   --   HGB 11.8* 9.9* 10.3*  HCT 35.3* 29.7* 30.4*  MCV 94.4 94.3 93.8  PLT 179 155 166   Cardiac Enzymes: No results found for this basename: CKTOTAL, CKMB, CKMBINDEX, TROPONINI,  in the last 168 hours BNP (last 3 results) No results found for this basename: PROBNP,  in the last 8760 hours CBG: No results found for this basename: GLUCAP,  in the last 168 hours  Recent Results (from the past 240 hour(s))  URINE CULTURE     Status: None   Collection Time    06/21/13 11:26 AM      Result Value Ref Range Status   Specimen Description URINE, CATHETERIZED   Final   Special Requests NONE   Final   Culture  Setup Time     Final   Value: 06/21/2013 23:38     Performed at [facility]     Final  Value: >=100,000 COLONIES/ML     Performed at [facility]   Culture     Final   Value: AEROCOCCUS SPECIES     Note: Standardized susceptibility testing for this organism is not available.     Performed at [facility]   Report Status 06/24/2013 FINAL   Final     Studies: No results found.  Scheduled Meds: . aspirin  81 mg Oral Daily  . atorvastatin  20 mg Oral q1800  . bisoprolol  5 mg Oral Daily  . brimonidine  1 drop Both Eyes q12n4p  . cefTRIAXone (ROCEPHIN)  IV  1 g Intravenous Q24H  . enoxaparin (LOVENOX) injection  40 mg Subcutaneous Q24H  . ipratropium-albuterol  3 mL Nebulization BID  . isosorbide mononitrate  60 mg Oral Daily  . latanoprost  1 drop Both Eyes QHS  . levobunolol  1 drop Both Eyes q morning - 10a  . LORazepam  0.5 mg Intravenous Once  . tamsulosin  0.4 mg Oral QPC supper    Continuous Infusions: . sodium chloride 100 mL/hr at 06/23/13 1842    Principal Problem:   UTI (urinary tract infection) Active Problems:   Urinary tract infection   ARF (acute renal failure)   HTN (hypertension)   CAD (coronary artery disease)   Acute urinary retention    Time spent: 25 minutes. Greater than 50% of this time was spent in direct contact with the patient coordinating care.    [facility] Pager [phone]  If 7PM-7AM, please contact night-coverage at [email or web address], password [email or web address] 06/24/2013, 12:36 PM  LOS: 3 days

## 2013-06-24 NOTE — Progress Notes (Signed)
Chaplain saw pt while rounding and on referral from [facility] Jackson.  Jackson Diaz was lying in bed.  No family present.  Jackson Diaz was not oriented to place, and speaking of investments / money, time in war / Therapist, art.  He was able to reflect on his time in [place], [place] and time in the [employer].  Was not able to orient to being in hospital.  Provided comforting presence and support.  Will continue to follow and attempt to make contact with pt's daughter.   [place]

## 2013-06-25 NOTE — Progress Notes (Signed)
Clinical Social Work  MD spoke with CSW re: DC plans for patient. MD reports that dtr possibly interested in SNF placement at DC. CSW reviewed chart which stated that PT is recommending SNF placement. CSW called and left a message with dtr.  CSW will continue to follow.  [place], [place] [phone]

## 2013-06-25 NOTE — Progress Notes (Signed)
Physical Therapy Treatment Patient Details Name: Jackson Diaz MRN: [id number] DOB: 09-Oct-1914 Today's Date: 06/25/2013    History of Present Illness 80 yo male adm 06/21/13 with UTI; PMHx: HTN, CAD< DM    PT Comments    Pt appears more alert, mobilized to edge of bed, stood with extensive assistance at RW. Pt  Will need 24/7 caregivers and 2 persons at present to mobilize safely. SNF may be beneficial.  Follow Up Recommendations  SNF     Equipment Recommendations  None recommended by PT    Recommendations for Other Services       Precautions / Restrictions Precautions Precautions: Fall    Mobility  Bed Mobility Overal bed mobility: Needs Assistance       Supine to sit: Max assist     General bed mobility comments: pt was able to mobilize to the edge of the bed with  assist, use of rail max assist, HOB raised, pt able to slide up to [facility] with min assist, pt placed legs back into bed witn min assist.  Transfers Overall transfer level: Needs assistance Equipment used: Rolling walker (2 wheeled) Transfers: Sit to/from Stand Sit to Stand: Max assist         General transfer comment: cues for hand placement and wt shift; support to stand from bed, min to sit down  Ambulation/Gait             General Gait Details: not tested, only 1 theraoist available   Stairs            Wheelchair Mobility    Modified Rankin (Stroke Patients Only)       Balance   Sitting-balance support: Bilateral upper extremity supported;Feet supported Sitting balance-Leahy Scale: Poor Sitting balance - Comments: gradually gained balance sitting on the edge of bed. Postural control: Posterior lean                          Cognition Arousal/Alertness: Awake/alert Behavior During Therapy: WFL for tasks assessed/performed Overall Cognitive Status: Impaired/Different from baseline Area of Impairment: Orientation;Attention;Following  commands;Safety/judgement Orientation Level: Disoriented to;Place;Time;Situation Current Attention Level: Sustained   Following Commands: Follows one step commands inconsistently Safety/Judgement: Decreased awareness of safety;Decreased awareness of deficits   Problem Solving: Slow processing;Decreased initiation;Difficulty sequencing;Requires verbal cues;Requires tactile cues General Comments: pt more  able  to participate and follow commands today but remains a safety/fall risk.     Exercises      General Comments        Pertinent Vitals/Pain No co    Home Living                      Prior Function            PT Goals (current goals can now be found in the care plan section) Progress towards PT goals: Progressing toward goals    Frequency  Min 3X/week    PT Plan Discharge plan needs to be updated    Co-evaluation             End of Session   Activity Tolerance: Patient tolerated treatment well Patient left: in bed;with call bell/phone within reach;with bed alarm set     Time: 3976-7341 PT Time Calculation (min): 17 min  Charges:  $Therapeutic Exercise: 8-22 mins                    G Codes:  Shella Maxim [facility] 06/25/2013, 3:48 PM Tresa Endo PT [phone]

## 2013-06-25 NOTE — Progress Notes (Signed)
[facility] PROGRESS NOTE  Jackson Diaz:[id number] DOB: [birth year] DOA: 06/21/2013 PCP: Tawanna Solo, MD  Assessment/Plan: UTI -Cx with aerococcus. -Curbside consult with ID: can treat with amoxicillin or keflex. -Will leave on rocephin while in the hospital given degree of cystitis evidenced on CT scan.  Acute Urinary Retention -Failed voiding trial. -Foley replaced. -Will DC with foley and OP GU follow up. -Likely associated to his severe cystitis and prostate enlargement.  ARF -Resolved. -Likely related to UTI/retention and mild dehydration.  Acute Metabolic Encephalopathy -2/2 UTI and probably hospital delirium. -Much improved today.  HTN -Well controlled. -Continue current regimen.  CAD -Stable. -No CP.  Code Status: DNR Family Communication: Spoke with daughter Lattie Haw at bedside today. Disposition Plan: Home when ready. Will need to consider SNF if mentation doesn't significantly improve.   Consultants:  None   Antibiotics:  Rocephin   Subjective: "I feel better than yesterday". Able to tell me name, year and that he is at [facility].  Objective: Filed Vitals:   06/24/13 1500 06/24/13 2021 06/24/13 2134 06/25/13 0523  BP: 136/67  136/63 168/84  Pulse: 57  55 57  Temp: 98.5 F (36.9 C)  98.5 F (36.9 C) 98.3 F (36.8 C)  TempSrc: Oral  Axillary Oral  Resp: 18  20 20   Height:      Weight:      SpO2: 95% 97% 96% 91%    Intake/Output Summary (Last 24 hours) at 06/25/13 1422 Last data filed at 06/25/13 0800  Gross per 24 hour  Intake    600 ml  Output   1000 ml  Net   -400 ml   Filed Weights   06/21/13 1426  Weight: 76.204 kg (168 lb)    Exam:   General:  AA Ox3, drowsy, but awakens easily to voice.  Cardiovascular: RRR  Respiratory: CTA B  Abdomen: S/NT/ND/+BS  Extremities: no C/C/E  Neurologic:  Non-focal  Data Reviewed: Basic Metabolic Panel:  Recent Labs Lab 06/21/13 1050 06/22/13 0502  06/23/13 0540  NA 141 140 141  K 4.2 3.5* 3.8  CL 101 107 106  CO2 24 24 24   GLUCOSE 105* 97 93  BUN 38* 31* 22  CREATININE 2.02* 1.15 0.81  CALCIUM 9.2 8.5 8.6   Liver Function Tests:  Recent Labs Lab 06/22/13 0502  AST 53*  ALT 27  ALKPHOS 54  BILITOT 0.5  PROT 5.2*  ALBUMIN 2.5*   No results found for this basename: LIPASE, AMYLASE,  in the last 168 hours No results found for this basename: AMMONIA,  in the last 168 hours CBC:  Recent Labs Lab 06/21/13 1050 06/22/13 0502 06/23/13 0540  WBC 7.9 6.2 7.3  NEUTROABS 5.9  --   --   HGB 11.8* 9.9* 10.3*  HCT 35.3* 29.7* 30.4*  MCV 94.4 94.3 93.8  PLT 179 155 166   Cardiac Enzymes: No results found for this basename: CKTOTAL, CKMB, CKMBINDEX, TROPONINI,  in the last 168 hours BNP (last 3 results) No results found for this basename: PROBNP,  in the last 8760 hours CBG: No results found for this basename: GLUCAP,  in the last 168 hours  Recent Results (from the past 240 hour(s))  URINE CULTURE     Status: None   Collection Time    06/21/13 11:26 AM      Result Value Ref Range Status   Specimen Description URINE, CATHETERIZED   Final   Special Requests NONE   Final  Culture  Setup Time     Final   Value: 06/21/2013 23:38     Performed at [facility]     Final   Value: >=100,000 COLONIES/ML     Performed at [facility]   Culture     Final   Value: AEROCOCCUS SPECIES     Note: Standardized susceptibility testing for this organism is not available.     Performed at [facility]   Report Status 06/24/2013 FINAL   Final     Studies: No results found.  Scheduled Meds: . aspirin  81 mg Oral Daily  . atorvastatin  20 mg Oral q1800  . bisoprolol  5 mg Oral Daily  . brimonidine  1 drop Both Eyes q12n4p  . cefTRIAXone (ROCEPHIN)  IV  1 g Intravenous Q24H  . enoxaparin (LOVENOX) injection  40 mg Subcutaneous Q24H  . ipratropium-albuterol  3 mL Nebulization BID  .  isosorbide mononitrate  60 mg Oral Daily  . latanoprost  1 drop Both Eyes QHS  . levobunolol  1 drop Both Eyes q morning - 10a  . LORazepam  0.5 mg Intravenous Once  . tamsulosin  0.4 mg Oral QPC supper   Continuous Infusions: . sodium chloride 30 mL/hr at 06/24/13 1900    Principal Problem:   UTI (urinary tract infection) Active Problems:   Urinary tract infection   ARF (acute renal failure)   HTN (hypertension)   CAD (coronary artery disease)   Acute urinary retention    Time spent: 35 minutes. Greater than 50% of this time was spent in direct contact with the patient coordinating care.    [facility] Pager [phone]  If 7PM-7AM, please contact night-coverage at [email or web address], password [facility] 06/25/2013, 2:22 PM  LOS: 4 days

## 2013-06-26 LAB — CBC
HEMATOCRIT: 33.4 % — AB (ref 39.0–52.0)
HEMOGLOBIN: 11.3 g/dL — AB (ref 13.0–17.0)
MCH: 31.4 pg (ref 26.0–34.0)
MCHC: 33.8 g/dL (ref 30.0–36.0)
MCV: 92.8 fL (ref 78.0–100.0)
Platelets: 197 10*3/uL (ref 150–400)
RBC: 3.6 MIL/uL — AB (ref 4.22–5.81)
RDW: 13.4 % (ref 11.5–15.5)
WBC: 8.4 10*3/uL (ref 4.0–10.5)

## 2013-06-26 LAB — BASIC METABOLIC PANEL
BUN: 15 mg/dL (ref 6–23)
CHLORIDE: 105 meq/L (ref 96–112)
CO2: 25 mEq/L (ref 19–32)
Calcium: 8.6 mg/dL (ref 8.4–10.5)
Creatinine, Ser: 0.81 mg/dL (ref 0.50–1.35)
GFR calc Af Amer: 84 mL/min — ABNORMAL LOW (ref >90)
GFR calc non Af Amer: 72 mL/min — ABNORMAL LOW (ref >90)
Glucose, Bld: 103 mg/dL — ABNORMAL HIGH (ref 70–99)
POTASSIUM: 3.6 meq/L — AB (ref 3.7–5.3)
SODIUM: 141 meq/L (ref 137–147)

## 2013-06-26 NOTE — Progress Notes (Signed)
[facility] PROGRESS NOTE  Jackson Diaz PJA:[id number] DOB: [birth year] DOA: 06/21/2013 PCP: Tawanna Solo, MD  Assessment/Plan: UTI -Cx with aerococcus. -Curbside consult with ID: can treat with amoxicillin or keflex. -Will leave on rocephin while in the hospital given degree of cystitis evidenced on CT scan.  Acute Urinary Retention -Failed voiding trial. -Foley replaced. -Will DC with foley and OP GU follow up. -Likely associated to his severe cystitis and prostate enlargement.  ARF -Resolved. -Likely related to UTI/retention and mild dehydration.  Acute Metabolic Encephalopathy -2/2 UTI and probably hospital delirium. -Much improved today.  HTN -Well controlled. -Continue current regimen.  CAD -Stable. -No CP.  Code Status: DNR Family Communication: none at bedside.  Disposition Plan: Will need to consider SNF if mentation doesn't significantly improve.   Consultants:  None   Antibiotics:  Rocephin    Subjective: Feeling better  Wants to know when he can go home.   Objective: Filed Vitals:   06/25/13 2215 06/26/13 0600 06/26/13 0909 06/26/13 1402  BP: 158/69 160/61  117/68  Pulse: 64 60  90  Temp: 98.7 F (37.1 C) 98.4 F (36.9 C)  97.4 F (36.3 C)  TempSrc: Oral Oral  Oral  Resp: 18 16  20   Height:      Weight:      SpO2:  93% 95% 93%    Intake/Output Summary (Last 24 hours) at 06/26/13 1432 Last data filed at 06/26/13 1214  Gross per 24 hour  Intake   1240 ml  Output   1500 ml  Net   -260 ml   Filed Weights   06/21/13 1426  Weight: 76.204 kg (168 lb)    Exam:   General:  AA Ox3, drowsy, but awakens easily to voice.  Cardiovascular: RRR  Respiratory: CTA B  Abdomen: S/NT/ND/+BS  Extremities: no C/C/E  Neurologic:  Non-focal  Data Reviewed: Basic Metabolic Panel:  Recent Labs Lab 06/21/13 1050 06/22/13 0502 06/23/13 0540 06/26/13 0441  NA 141 140 141 141  K 4.2 3.5* 3.8 3.6*  CL 101 107 106 105    CO2 24 24 24 25   GLUCOSE 105* 97 93 103*  BUN 38* 31* 22 15  CREATININE 2.02* 1.15 0.81 0.81  CALCIUM 9.2 8.5 8.6 8.6   Liver Function Tests:  Recent Labs Lab 06/22/13 0502  AST 53*  ALT 27  ALKPHOS 54  BILITOT 0.5  PROT 5.2*  ALBUMIN 2.5*   No results found for this basename: LIPASE, AMYLASE,  in the last 168 hours No results found for this basename: AMMONIA,  in the last 168 hours CBC:  Recent Labs Lab 06/21/13 1050 06/22/13 0502 06/23/13 0540 06/26/13 0441  WBC 7.9 6.2 7.3 8.4  NEUTROABS 5.9  --   --   --   HGB 11.8* 9.9* 10.3* 11.3*  HCT 35.3* 29.7* 30.4* 33.4*  MCV 94.4 94.3 93.8 92.8  PLT 179 155 166 197   Cardiac Enzymes: No results found for this basename: CKTOTAL, CKMB, CKMBINDEX, TROPONINI,  in the last 168 hours BNP (last 3 results) No results found for this basename: PROBNP,  in the last 8760 hours CBG: No results found for this basename: GLUCAP,  in the last 168 hours  Recent Results (from the past 240 hour(s))  URINE CULTURE     Status: None   Collection Time    06/21/13 11:26 AM      Result Value Ref Range Status   Specimen Description URINE, CATHETERIZED   Final  Special Requests NONE   Final   Culture  Setup Time     Final   Value: 06/21/2013 23:38     Performed at [facility]     Final   Value: >=100,000 COLONIES/ML     Performed at [facility]   Culture     Final   Value: AEROCOCCUS SPECIES     Note: Standardized susceptibility testing for this organism is not available.     Performed at [facility]   Report Status 06/24/2013 FINAL   Final     Studies: No results found.  Scheduled Meds: . aspirin  81 mg Oral Daily  . atorvastatin  20 mg Oral q1800  . bisoprolol  5 mg Oral Daily  . brimonidine  1 drop Both Eyes q12n4p  . cefTRIAXone (ROCEPHIN)  IV  1 g Intravenous Q24H  . enoxaparin (LOVENOX) injection  40 mg Subcutaneous Q24H  . ipratropium-albuterol  3 mL Nebulization BID  .  isosorbide mononitrate  60 mg Oral Daily  . latanoprost  1 drop Both Eyes QHS  . levobunolol  1 drop Both Eyes q morning - 10a  . LORazepam  0.5 mg Intravenous Once  . tamsulosin  0.4 mg Oral QPC supper   Continuous Infusions: . sodium chloride 30 mL/hr at 06/24/13 1900    Principal Problem:   UTI (urinary tract infection) Active Problems:   Urinary tract infection   ARF (acute renal failure)   HTN (hypertension)   CAD (coronary artery disease)   Acute urinary retention    Time spent: 35 minutes. Greater than 50% of this time was spent in direct contact with the patient coordinating care.    Hosie Poisson  [facility] Pager [phone]  If 7PM-7AM, please contact night-coverage at [email or web address], password [facility] 06/26/2013, 2:32 PM  LOS: 5 days

## 2013-06-26 NOTE — Progress Notes (Addendum)
Clinical Social Work  CSW spoke with patient's dtr and [facility] was unable to accept so dtr chose [facility]. [facility] is agreeable to accept patient when medically stable. Dtr is very happy about choice and reports she has friends that works at [facility]. CSW contacted [facility] who will complete authorization and will assist with obtaining ambulance authorization. Insurance will call CSW with insurance authorization. CSW will continue to follow and will assist with DC when patient is medically stable.  Sindy Messing, [place] [phone]  GYIRSWNI [id number] Insurance approval for SNF Auth # [id number] valid from 5/28-6/1 Insurance will request ambulance authorization and call CSW back with auth #

## 2013-06-26 NOTE — Progress Notes (Addendum)
Clinical Social Work Department CLINICAL SOCIAL WORK PLACEMENT NOTE 06/26/2013  Patient:  Jackson Diaz, Jackson Diaz  Account Number:  [id number] Admit date:  06/21/2013  Clinical Social Worker:  Sindy Messing, LCSW  Date/time:  06/26/2013 11:15 AM  Clinical Social Work is seeking post-discharge placement for this patient at the following level of care:   SKILLED NURSING   (*CSW will update this form in Epic as items are completed)   06/26/2013  Patient/family provided with [place] Department of Clinical Social Work's list of facilities offering this level of care within the geographic area requested by the patient (or if unable, by the patient's family).  06/26/2013  Patient/family informed of their freedom to choose among providers that offer the needed level of care, that participate in Medicare, Medicaid or managed care program needed by the patient, have an available bed and are willing to accept the patient.  06/26/2013  Patient/family informed of [facility]' ownership interest in [facility], as well as of the fact that they are under no obligation to receive care at this facility.  PASARR submitted to EDS on 06/26/2013 PASARR number received from EDS on 06/26/2013  FL2 transmitted to all facilities in geographic area requested by pt/family on  06/26/2013 FL2 transmitted to all facilities within larger geographic area on   Patient informed that his/her managed care company has contracts with or will negotiate with  certain facilities, including the following:     Patient/family informed of bed offers received:  06/26/13 Patient chooses bed at [facility] Physician recommends and patient chooses bed at    Patient to be transferred to [facility] on  06/28/13 Patient to be transferred to facility by [facility]  The following physician request were entered in Epic:   Additional Comments:

## 2013-06-26 NOTE — Progress Notes (Signed)
Clinical Social Work  CSW went to room but no family present and patient still confused. CSW left another message with dtr inquiring about DC plans. CSW has spoken with [facility] Medicare (Beth ext: [phone]) re: possible SNF needs at DC. CSW will continue to follow.  [place], [place] [phone]

## 2013-06-26 NOTE — Progress Notes (Signed)
OT Cancellation Note  Patient Details Name: Jackson Diaz MRN: [id number] DOB: 1914-03-15   Cancelled Treatment:    Pt confused and no family present.  Will re attempt OT when family present.   Betsy Pries 06/26/2013, 10:50 AM

## 2013-06-26 NOTE — Progress Notes (Signed)
Clinical Social Work  Dtr returned [place] phone call and agreeable to SNF placement. CSW left SNF list in room for dtr to review and dtr is agreeable to [facility] search. Dtr reports she is interested in [facility] and has friends that work at facility. CSW encouraged dtr to review list and have alternative options due to usual waiting list at [facility]. Dtr aware and agreeable. CSW explained insurance process as well. CSW completed FL2 and faxed out. CSW will follow up with bed offers.  [place], [place] [phone]

## 2013-06-27 ENCOUNTER — Inpatient Hospital Stay (HOSPITAL_COMMUNITY): Payer: Medicare Other

## 2013-06-27 MED ORDER — FUROSEMIDE 10 MG/ML IJ SOLN
40.0000 mg | Freq: Once | INTRAMUSCULAR | Status: AC
  Administered 2013-06-27: 40 mg via INTRAVENOUS
  Filled 2013-06-27: qty 4

## 2013-06-27 NOTE — Care Management Note (Unsigned)
    Page 1 of 1   06/27/2013     11:53:12 AM CARE MANAGEMENT NOTE 06/27/2013  Patient:  SEIJI, WISWELL A   Account Number:  [id number]  Date Initiated:  06/27/2013  Documentation initiated by:  [facility]  Subjective/Objective Assessment:   80 year old male admitted with UTI, AKI and acute encephalopathy.     Action/Plan:   From home but will d/c to SNF when medically ready.   Anticipated DC Date:  06/30/2013   Anticipated DC Plan:  SKILLED NURSING FACILITY  In-house referral  Clinical Social Worker      DC Planning Services  CM consult      Choice offered to / List presented to:             Status of service:  Completed, signed off Medicare Important Message given?  YES (If response is "NO", the following Medicare IM given date fields will be blank) Date Medicare IM given:  06/27/2013 Date Additional Medicare IM given:    Discharge Disposition:    Per UR Regulation:  Reviewed for med. necessity/level of care/duration of stay  If discussed at [place] of Stay Meetings, dates discussed:    Comments:

## 2013-06-27 NOTE — Progress Notes (Signed)
[facility] PROGRESS NOTE  Jackson Diaz WGY:[id number] DOB: 12/21/1914 DOA: 06/21/2013 PCP: Tawanna Solo, MD  Assessment/Plan: UTI -Cx with aerococcus. -Curbside consult with ID: can treat with amoxicillin or keflex. -Will leave on rocephin while in the hospital given degree of cystitis evidenced on CT scan.  Acute Urinary Retention -Failed voiding trial. -Foley replaced. -Will DC with foley and OP GU follow up. -Likely associated to his severe cystitis and prostate enlargement.  ARF -Resolved. -Likely related to UTI/retention and mild dehydration.  Acute Metabolic Encephalopathy -2/2 UTI and probably hospital delirium. - improving, but today he is more sleepy.   HTN -Well controlled. -Continue current regimen.  CAD -Stable. -No CP.  Cough, sob and rhonchi on exam: CXR revealed pulmonary congestin. Stopped fluids ordered one dose of lasix.   Code Status: DNR Family Communication: none at bedside.  Disposition Plan: Will need to consider SNF if mentation doesn't significantly improve.   Consultants:  None   Antibiotics:  Rocephin    Subjective: More sleepy today. Did not eat breakfast or lunch.   Objective: Filed Vitals:   06/26/13 2200 06/27/13 0544 06/27/13 0802 06/27/13 1405  BP:  160/68  148/58  Pulse: 80   59  Temp: 97.6 F (36.4 C) 99.2 F (37.3 C)  98.4 F (36.9 C)  TempSrc: Oral Axillary  Axillary  Resp: 18 18  16   Height:      Weight:      SpO2: 99% 92% 97% 94%    Intake/Output Summary (Last 24 hours) at 06/27/13 2005 Last data filed at 06/27/13 1841  Gross per 24 hour  Intake   1250 ml  Output   3250 ml  Net  -2000 ml   Filed Weights   06/21/13 1426  Weight: 76.204 kg (168 lb)    Exam:   General:  AA Ox3, drowsy, but awakens easily to voice.  Cardiovascular: RRR  Respiratory: scattered rhonchi.   Abdomen: S/NT/ND/+BS  Extremities: no C/C/E  Neurologic:  Non-focal  Data Reviewed: Basic Metabolic  Panel:  Recent Labs Lab 06/21/13 1050 06/22/13 0502 06/23/13 0540 06/26/13 0441  NA 141 140 141 141  K 4.2 3.5* 3.8 3.6*  CL 101 107 106 105  CO2 24 24 24 25   GLUCOSE 105* 97 93 103*  BUN 38* 31* 22 15  CREATININE 2.02* 1.15 0.81 0.81  CALCIUM 9.2 8.5 8.6 8.6   Liver Function Tests:  Recent Labs Lab 06/22/13 0502  AST 53*  ALT 27  ALKPHOS 54  BILITOT 0.5  PROT 5.2*  ALBUMIN 2.5*   No results found for this basename: LIPASE, AMYLASE,  in the last 168 hours No results found for this basename: AMMONIA,  in the last 168 hours CBC:  Recent Labs Lab 06/21/13 1050 06/22/13 0502 06/23/13 0540 06/26/13 0441  WBC 7.9 6.2 7.3 8.4  NEUTROABS 5.9  --   --   --   HGB 11.8* 9.9* 10.3* 11.3*  HCT 35.3* 29.7* 30.4* 33.4*  MCV 94.4 94.3 93.8 92.8  PLT 179 155 166 197   Cardiac Enzymes: No results found for this basename: CKTOTAL, CKMB, CKMBINDEX, TROPONINI,  in the last 168 hours BNP (last 3 results) No results found for this basename: PROBNP,  in the last 8760 hours CBG: No results found for this basename: GLUCAP,  in the last 168 hours  Recent Results (from the past 240 hour(s))  URINE CULTURE     Status: None   Collection Time    06/21/13 11:26  AM      Result Value Ref Range Status   Specimen Description URINE, CATHETERIZED   Final   Special Requests NONE   Final   Culture  Setup Time     Final   Value: 06/21/2013 23:38     Performed at [facility]     Final   Value: >=100,000 COLONIES/ML     Performed at [facility]   Culture     Final   Value: AEROCOCCUS SPECIES     Note: Standardized susceptibility testing for this organism is not available.     Performed at [facility]   Report Status 06/24/2013 FINAL   Final     Studies: Dg Chest 2 View  06/27/2013   CLINICAL DATA:  Shortness of breath.  EXAM: CHEST  2 VIEW  COMPARISON:  02/22/2013  FINDINGS: Moderate enlargement of the cardiac silhouette is unchanged. Thoracic  aortic calcification is again seen. There is increased pulmonary vascular congestion with mildly increased interstitial opacities bilaterally. There are small bilateral pleural effusions. Retrocardiac parenchymal opacity in the left lower lobe may reflect subsegmental atelectasis. No pneumothorax is identified. No acute osseous abnormality is identified.  IMPRESSION: Cardiomegaly with increased pulmonary vascular congestion and mild interstitial edema. Small bilateral pleural effusions.   Electronically Signed   By: Logan Bores   On: 06/27/2013 13:49    Scheduled Meds: . aspirin  81 mg Oral Daily  . atorvastatin  20 mg Oral q1800  . bisoprolol  5 mg Oral Daily  . brimonidine  1 drop Both Eyes q12n4p  . cefTRIAXone (ROCEPHIN)  IV  1 g Intravenous Q24H  . enoxaparin (LOVENOX) injection  40 mg Subcutaneous Q24H  . ipratropium-albuterol  3 mL Nebulization BID  . isosorbide mononitrate  60 mg Oral Daily  . latanoprost  1 drop Both Eyes QHS  . levobunolol  1 drop Both Eyes q morning - 10a  . LORazepam  0.5 mg Intravenous Once  . tamsulosin  0.4 mg Oral QPC supper   Continuous Infusions:    Principal Problem:   UTI (urinary tract infection) Active Problems:   Urinary tract infection   ARF (acute renal failure)   HTN (hypertension)   CAD (coronary artery disease)   Acute urinary retention    Time spent: 35 minutes. Greater than 50% of this time was spent in direct contact with the patient coordinating care.    Hosie Poisson  [facility] Pager [phone]  If 7PM-7AM, please contact night-coverage at [email or web address], password [facility] 06/27/2013, 8:05 PM  LOS: 6 days

## 2013-06-27 NOTE — Progress Notes (Signed)
Clinical Social Work  Per MD, patient not medically stable for DC today. CSW updated [facility] who remains agreeable to accept when stable. CSW alerted dtr of no DC today. [facility] Medicare ambulance auth # [id number].  CSW will continue to follow.  [place], [place] [phone]

## 2013-06-27 NOTE — Progress Notes (Signed)
Physical Therapy Treatment Patient Details Name: Jackson Diaz MRN: [id number] DOB: 08-15-1914 Today's Date: 06/27/2013    History of Present Illness 80 yo male adm 06/21/13 with UTI; PMHx: HTN, CAD, DM    PT Comments    Pt appears lethargic and confused this session.  Assisted to sitting and pt talkative however difficultly understanding speech and pt often nonsensical.  Pt made no physical attempts to assist with mobility or sit upright so positioned back to supine.   Follow Up Recommendations  SNF     Equipment Recommendations  None recommended by PT    Recommendations for Other Services       Precautions / Restrictions Precautions Precautions: Fall    Mobility  Bed Mobility Overal bed mobility: Needs Assistance Bed Mobility: Supine to Sit;Sit to Supine     Supine to sit: +2 for physical assistance;Total assist Sit to supine: +2 for physical assistance;Total assist   General bed mobility comments: pt given cues to assist however no initiation requiring total assist  Transfers                    Ambulation/Gait                 Stairs            Wheelchair Mobility    Modified Rankin (Stroke Patients Only)       Balance Overall balance assessment: Needs assistance Sitting-balance support: Feet supported;Bilateral upper extremity supported Sitting balance-Leahy Scale: Zero Sitting balance - Comments: pt would not control trunk, wished to return to supine, given multimodal cues however pt did not attempt to maintain trunk upright                            Cognition Arousal/Alertness: Awake/alert Behavior During Therapy: WFL for tasks assessed/performed Overall Cognitive Status: Impaired/Different from baseline Area of Impairment: Orientation;Attention;Following commands;Safety/judgement Orientation Level: Disoriented to;Place;Time;Situation     Following Commands: Follows one step commands  inconsistently Safety/Judgement: Decreased awareness of safety;Decreased awareness of deficits   Problem Solving: Slow processing;Decreased initiation;Difficulty sequencing;Requires verbal cues;Requires tactile cues General Comments: pt appears more lethargic and confused today, difficultly understanding speech, mumbling, attempted to answer questions however never opened eyes    Exercises      General Comments        Pertinent Vitals/Pain No signs of distress     Home Living                      Prior Function            PT Goals (current goals can now be found in the care plan section) Progress towards PT goals: Not progressing toward goals - comment (lethargic, confused)    Frequency  Min 3X/week    PT Plan Current plan remains appropriate    Co-evaluation             End of Session   Activity Tolerance: Patient limited by lethargy Patient left: in bed;with bed alarm set;with call bell/phone within reach     Time: 1001-1014 PT Time Calculation (min): 13 min  Charges:  $Therapeutic Activity: 8-22 mins                    G Codes:      Junius Argyle 06/27/2013, 2:39 PM Carmelia Bake, PT, DPT 06/27/2013 Pager: [phone]

## 2013-06-28 LAB — CBC
HEMATOCRIT: 36.5 % — AB (ref 39.0–52.0)
HEMOGLOBIN: 11.8 g/dL — AB (ref 13.0–17.0)
MCH: 30.6 pg (ref 26.0–34.0)
MCHC: 32.3 g/dL (ref 30.0–36.0)
MCV: 94.6 fL (ref 78.0–100.0)
Platelets: 191 10*3/uL (ref 150–400)
RBC: 3.86 MIL/uL — ABNORMAL LOW (ref 4.22–5.81)
RDW: 13.7 % (ref 11.5–15.5)
WBC: 8.4 10*3/uL (ref 4.0–10.5)

## 2013-06-28 LAB — BASIC METABOLIC PANEL
BUN: 14 mg/dL (ref 6–23)
CALCIUM: 8.8 mg/dL (ref 8.4–10.5)
CO2: 26 meq/L (ref 19–32)
CREATININE: 0.84 mg/dL (ref 0.50–1.35)
Chloride: 103 mEq/L (ref 96–112)
GFR calc Af Amer: 82 mL/min — ABNORMAL LOW (ref >90)
GFR calc non Af Amer: 71 mL/min — ABNORMAL LOW (ref >90)
GLUCOSE: 77 mg/dL (ref 70–99)
Potassium: 3.4 mEq/L — ABNORMAL LOW (ref 3.7–5.3)
Sodium: 142 mEq/L (ref 137–147)

## 2013-06-28 MED ORDER — TAMSULOSIN HCL 0.4 MG PO CAPS: 0.4000 mg | ORAL_CAPSULE | Freq: Every day | ORAL | Status: AC

## 2013-06-28 MED ORDER — FUROSEMIDE 40 MG PO TABS
40.0000 mg | ORAL_TABLET | Freq: Once | ORAL | Status: AC
  Administered 2013-06-28: 40 mg via ORAL
  Filled 2013-06-28: qty 1

## 2013-06-28 MED ORDER — POTASSIUM CHLORIDE CRYS ER 20 MEQ PO TBCR
40.0000 meq | EXTENDED_RELEASE_TABLET | Freq: Once | ORAL | Status: AC
  Administered 2013-06-28: 40 meq via ORAL
  Filled 2013-06-28: qty 2

## 2013-06-28 NOTE — Discharge Summary (Signed)
Physician Discharge Summary  Jackson Diaz DOB: 1914/10/15 DOA: 06/21/2013  PCP: Jackson Solo, MD  Admit date: 06/21/2013 Discharge date: 06/28/2013  Time spent: 35 minutes  Recommendations for Outpatient Follow-up:  1. Follow u pwith urology in one week 2. Follow up with PCP in one week 3. You are being discharged to [facility] for rehabilitation.   Discharge Diagnoses:  Principal Problem:   UTI (urinary tract infection) Active Problems:   Urinary tract infection   ARF (acute renal failure)   HTN (hypertension)   CAD (coronary artery disease)   Acute urinary retention   Discharge Condition: improved.   Diet recommendation: low sodium diet.   Filed Weights   06/21/13 1426  Weight: 76.204 kg (168 lb)    History of present illness:  Jackson Diaz is a 80 y.o. male with PMH of HTN, CAD, remote h/o BPH vs Prostate CA( Pt and family unsure of this, no workup was done) was in his usual state of health till about 3 days ago when he started having problems with urinary retention and being able to urinate in dribbles only.  He has been having lower pelvic discomfort and his daughter also noticed mild confusion. Cogntively at baseline he is appropriate except for occasional lapses in short term memory and forgetfulness.  Today he went to his PCP's office and was only able to dribble some drops of urine which was abnormal appearing per report, then sent to ER.  In Er, foley placed 200cc foul smelling urine obtained  Noted to have UTI, AKI   Hospital Course:  UTI  -Cx with aerococcus.  -Curbside consult with ID: can treat with amoxicillin or keflex but he completed a course of 7 days of rocephin.   Acute Urinary Retention  -Failed voiding trial.  -Foley replaced.  -Will DC with foley and OP GU follow up.  -Likely associated to his severe cystitis and prostate enlargement.   ARF  -Resolved.  -Likely related to UTI/retention and mild dehydration.   Acute  Metabolic Encephalopathy  -2/2 UTI and probably hospital delirium.  Resolved.   HTN  -Well controlled.  -Continue current regimen.  CAD  -Stable.  -No CP.  Hypokalemia: repleted as needed.   Anemia: stable. Normocytic.   Pulmonary vascular congestion: Probably from the fluids he has been getting during the hospitalization. Lasix given and his oxygen sats have been normal.    Procedures:  CT   Consultations:  none  Discharge Exam: Filed Vitals:   06/28/13 0526  BP: 165/75  Pulse: 54  Temp: 98 F (36.7 C)  Resp: 18    General: alert afebrile comfortable Cardiovascular: s1s2 Respiratory: ctab  Discharge Instructions You were cared for by a hospitalist during your hospital stay. If you have any questions about your discharge medications or the care you received while you were in the hospital after you are discharged, you can call the unit and asked to speak with the hospitalist on call if the hospitalist that took care of you is not available. Once you are discharged, your primary care physician will handle any further medical issues. Please note that NO REFILLS for any discharge medications will be authorized once you are discharged, as it is imperative that you return to your primary care physician (or establish a relationship with a primary care physician if you do not have one) for your aftercare needs so that they can reassess your need for medications and monitor your lab values.  Discharge Instructions   Diet - low sodium heart healthy    Complete by:  As directed      Discharge instructions    Complete by:  As directed   Follow up with urology in one week Follow up with PCP in one week.            Medication List         aspirin 81 MG tablet  Take 81 mg by mouth daily.     bisoprolol 5 MG tablet  Commonly known as:  ZEBETA  Take 5 mg by mouth daily.     brimonidine 0.2 % ophthalmic solution  Commonly known as:  ALPHAGAN  Place 1 drop into  both eyes 2 (two) times daily between meals as needed.     isosorbide mononitrate 60 MG 24 hr tablet  Commonly known as:  IMDUR  Take 60 mg by mouth daily.     latanoprost 0.005 % ophthalmic solution  Commonly known as:  XALATAN  1 drop at bedtime.     levobunolol 0.5 % ophthalmic solution  Commonly known as:  BETAGAN  Place 1 drop into both eyes every morning.     rosuvastatin 20 MG tablet  Commonly known as:  CRESTOR  Take 10 mg by mouth daily.     tamsulosin 0.4 MG Caps capsule  Commonly known as:  FLOMAX  Take 1 capsule (0.4 mg total) by mouth daily after supper.       Allergies  Allergen Reactions  . No Known Allergies    Follow-up Information   Follow up with Jackson Solo, MD. Schedule an appointment as soon as possible for a visit in 1 week.   Specialty:  Family Medicine   Contact information:   [place] [address] [phone]        The results of significant diagnostics from this hospitalization (including imaging, microbiology, ancillary and laboratory) are listed below for reference.    Significant Diagnostic Studies: Ct Abdomen Pelvis Wo Contrast  06/21/2013   CLINICAL DATA:  Acute kidney injury. UTI. Urinary retention. History of prostate carcinoma.  EXAM: CT ABDOMEN AND PELVIS WITHOUT CONTRAST  TECHNIQUE: Multidetector CT imaging of the abdomen and pelvis was performed following the standard protocol without IV contrast.  COMPARISON:  None.  FINDINGS: Kidneys are normal in size and position. No intrarenal stones. There is a low-density anterior mid pole left renal mass measuring 3 cm x 1.8 cm, likely a cyst. No other renal masses. There is mild dilation of portions of the right ureter and a right renal pelvis. No ureteral stone is seen. Left intrarenal collecting system and ureter are unremarkable.  Bladder is mostly decompressed by a Foley catheter. The bladder wall is thickened. There is inflammatory type stranding in the  peri-vesicular fat.  Prostate gland is significantly enlarged. It measures approximate 5.8 cm x 5.6 cm transversely. There is fullness of the right seminal vesicle.  There is a fat containing right inguinal hernia that also contains inflammatory type stranding. No bowel enters cysts.  There is a complex cystic mass that enlarged pancreatic head measuring 6.6 cm x 3.9 cm x 5.5 cm. There is irregularly dilated pancreatic duct distal to this containing some air, extending throughout the tail. A cystic pancreatic neoplasm must be considered. Findings could be the sequela of prior bouts of pancreatitis with multiple cysts and dilated ducts.  Heart is moderately enlarged. There is dependent subsegmental atelectasis in the lung bases.  Liver, spleen  and gallbladder are unremarkable. No bile duct dilation. No adrenal masses.  No pathologically enlarged lymph nodes. No ascites. Bowel is unremarkable.  Atherosclerotic calcifications are noted throughout the abdominal aorta and its branch vessels. Mild aortic ectasia.  There are advanced degenerative changes throughout the visualized spine. No osteoblastic or osteolytic lesions.  IMPRESSION: 1. Thickened bladder wall with perivesicular inflammatory type stranding consistent with a severe cystitis. 2. No other acute findings. 3. Complex cystic mass enlarges the pancreatic head. The chronicity of this is unclear since there are no prior exams. This could reflect a cystic neoplasm or be the consequence of previous episodes of pancreatitis. Followup contrast enhanced CT or, preferably, pancreatic MRI, is recommended. 4. Other chronic findings include cardiomegaly, a low-density left renal lesion, likely a cyst, atherosclerotic changes throughout the aorta and its branch vessels, prostatic enlargement, and advanced degenerative changes throughout the visualized spine.   Electronically Signed   By: Lajean Manes M.D.   On: 06/21/2013 20:43   Dg Chest 2 View  06/27/2013   CLINICAL  DATA:  Shortness of breath.  EXAM: CHEST  2 VIEW  COMPARISON:  02/22/2013  FINDINGS: Moderate enlargement of the cardiac silhouette is unchanged. Thoracic aortic calcification is again seen. There is increased pulmonary vascular congestion with mildly increased interstitial opacities bilaterally. There are small bilateral pleural effusions. Retrocardiac parenchymal opacity in the left lower lobe may reflect subsegmental atelectasis. No pneumothorax is identified. No acute osseous abnormality is identified.  IMPRESSION: Cardiomegaly with increased pulmonary vascular congestion and mild interstitial edema. Small bilateral pleural effusions.   Electronically Signed   By: Logan Bores   On: 06/27/2013 13:49    Microbiology: Recent Results (from the past 240 hour(s))  URINE CULTURE     Status: None   Collection Time    06/21/13 11:26 AM      Result Value Ref Range Status   Specimen Description URINE, CATHETERIZED   Final   Special Requests NONE   Final   Culture  Setup Time     Final   Value: 06/21/2013 23:38     Performed at [facility]     Final   Value: >=100,000 COLONIES/ML     Performed at [facility]   Culture     Final   Value: AEROCOCCUS SPECIES     Note: Standardized susceptibility testing for this organism is not available.     Performed at [facility]   Report Status 06/24/2013 FINAL   Final     Labs: Basic Metabolic Panel:  Recent Labs Lab 06/21/13 1050 06/22/13 0502 06/23/13 0540 06/26/13 0441 06/28/13 0415  NA 141 140 141 141 142  K 4.2 3.5* 3.8 3.6* 3.4*  CL 101 107 106 105 103  CO2 24 24 24 25 26   GLUCOSE 105* 97 93 103* 77  BUN 38* 31* 22 15 14   CREATININE 2.02* 1.15 0.81 0.81 0.84  CALCIUM 9.2 8.5 8.6 8.6 8.8   Liver Function Tests:  Recent Labs Lab 06/22/13 0502  AST 53*  ALT 27  ALKPHOS 54  BILITOT 0.5  PROT 5.2*  ALBUMIN 2.5*   No results found for this basename: LIPASE, AMYLASE,  in the last 168  hours No results found for this basename: AMMONIA,  in the last 168 hours CBC:  Recent Labs Lab 06/21/13 1050 06/22/13 0502 06/23/13 0540 06/26/13 0441 06/28/13 0415  WBC 7.9 6.2 7.3 8.4 8.4  NEUTROABS 5.9  --   --   --   --  HGB 11.8* 9.9* 10.3* 11.3* 11.8*  HCT 35.3* 29.7* 30.4* 33.4* 36.5*  MCV 94.4 94.3 93.8 92.8 94.6  PLT 179 155 166 197 191   Cardiac Enzymes: No results found for this basename: CKTOTAL, CKMB, CKMBINDEX, TROPONINI,  in the last 168 hours BNP: BNP (last 3 results) No results found for this basename: PROBNP,  in the last 8760 hours CBG: No results found for this basename: GLUCAP,  in the last 168 hours     Signed:  Hosie Poisson  [facility] 06/28/2013, 10:41 AM

## 2013-06-28 NOTE — Progress Notes (Signed)
Clinical Social Work  CSW faxed DC summary to [facility] who is agreeable to accept today and insurance authorization has been obtained. CSW prepared DC packet with FL2 and DNR included. CSW informed patient, dtr, and RN of DC plans and all parties agreeable. Dtr is very happy with facility and happy that patient is DC today. RN to call report. CSW coordinated transportation via [place]. Request #: [id number].  CSW is signing off but available if needed.  [place], [place] [phone]

## 2013-06-28 NOTE — Progress Notes (Signed)
Patient being discharged to SNF, [facility]. Report given to Jackson Latino, RN supervisor. Number left with staff if questions arise.

## 2013-07-01 NOTE — Progress Notes (Signed)
Late entry note after discharge:  Found dentures and eye glass case , phoned daughter to advise her the teeth are on [address] awaiting for her to pick up

## 2013-08-09 ENCOUNTER — Encounter (INDEPENDENT_AMBULATORY_CARE_PROVIDER_SITE_OTHER): Payer: Self-pay | Admitting: Surgery

## 2013-08-09 ENCOUNTER — Ambulatory Visit (INDEPENDENT_AMBULATORY_CARE_PROVIDER_SITE_OTHER): Payer: Medicare Other | Admitting: Surgery

## 2013-08-09 VITALS — BP 126/71 | HR 70 | Temp 98.6°F | Resp 18

## 2013-08-09 DIAGNOSIS — K4091 Unilateral inguinal hernia, without obstruction or gangrene, recurrent: Secondary | ICD-10-CM

## 2013-08-09 NOTE — Progress Notes (Signed)
Subjective:     Patient ID: Jackson Diaz, male   DOB: 1914/04/14, 80 y.o.   MRN: [id number]  HPI This is a gentleman who is wheelchair bound and in a nursing facility. I repaired an inguinal hernia on him a year ago.  It was a recurrent hernia.  He now presents with a recurrent bulge in the groin. He is very deconditioned and cannot even stand in the office. His family reports noticing a bulge and there was tenderness at the time but he is now pain-free with no obstructive symptoms  Review of Systems     Objective:   Physical Exam On exam, he has a lot of scarring in the groin. I cannot actually demonstrate the hernia defect as he can only sit in the wheelchair and cannot stand or lie flat. His abdomen is soft and nontender    Assessment:     Recurrent right inguinal hernia     Plan:     At this point, after discussion with the family, he is a poor surgical candidate. They understand this may become emergent. I discussed signs of incarceration. If it does become an emergency they're not even sure that'll allow him to have surgery. From a general surgical standpoint there is nothing further to offer. I will see him back as needed.

## 2013-12-31 DEATH — deceased

## 2015-07-19 IMAGING — CT CT ABD-PELV W/O CM
2 of 4 series · 16 of 46 positions shown, 18 images · non-contrast
Comparison: None.

CLINICAL DATA: Acute kidney injury. UTI. Urinary retention. History
of prostate carcinoma.

EXAM:
CT ABDOMEN AND PELVIS WITHOUT CONTRAST
TECHNIQUE: Multidetector CT imaging of the abdomen and pelvis was performed
following the standard protocol without IV contrast.

[Series 2: rtn a/p w/o · axial · non-contrast · 0.87mm/px · z∈[-594,-174]mm · 13 of 94 slices shown, 15 images]
[im 5/94  soft-tissue]
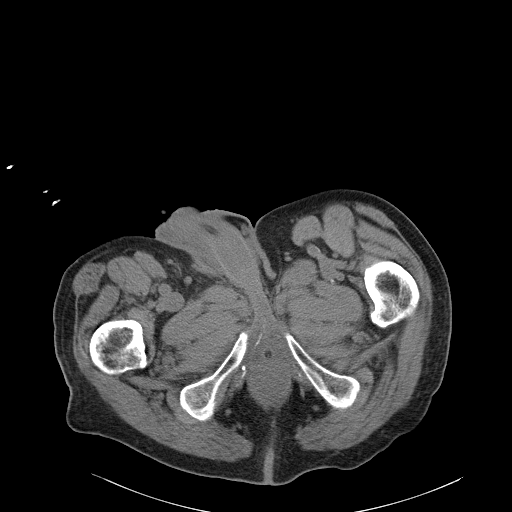
[im 5/94  bone]
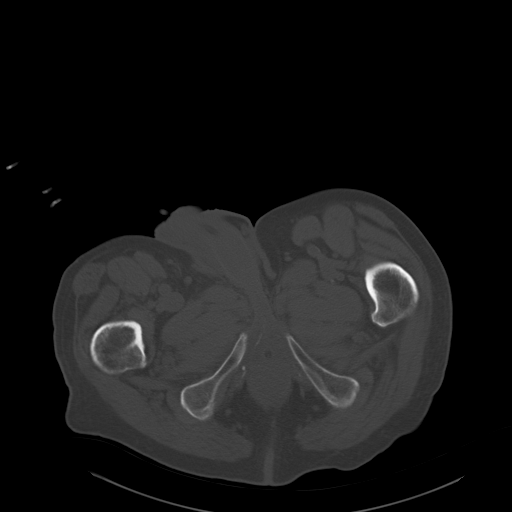
[im 13/94  soft-tissue]
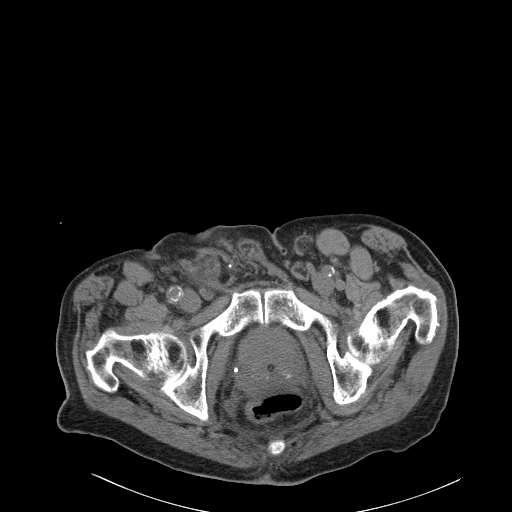
[im 21/94  soft-tissue]
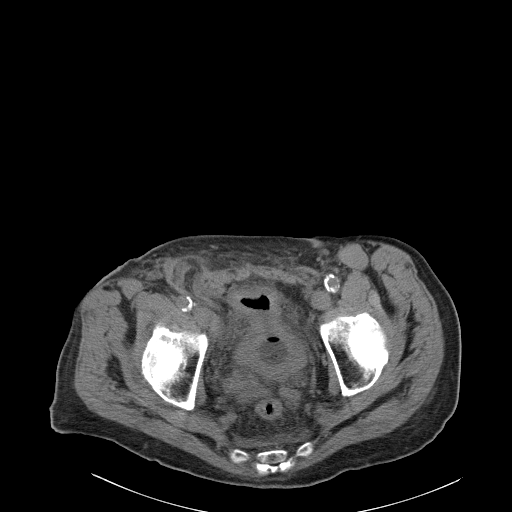
[im 25/94  soft-tissue]
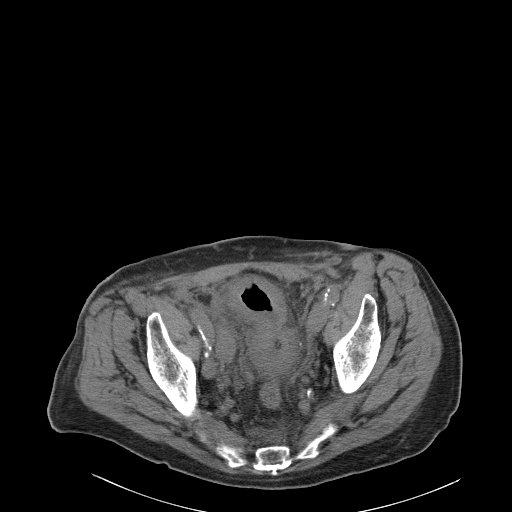
[im 33/94  soft-tissue]
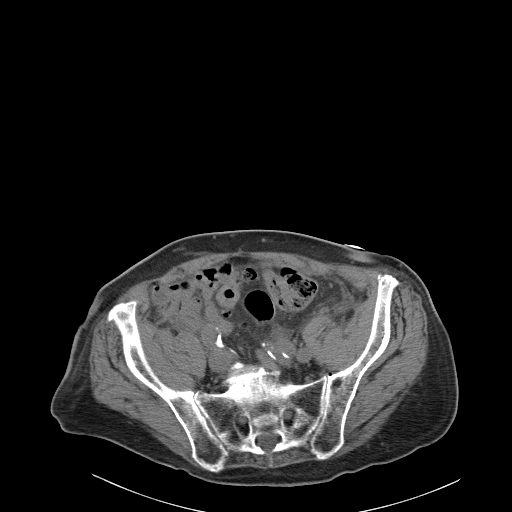
[im 41/94  soft-tissue]
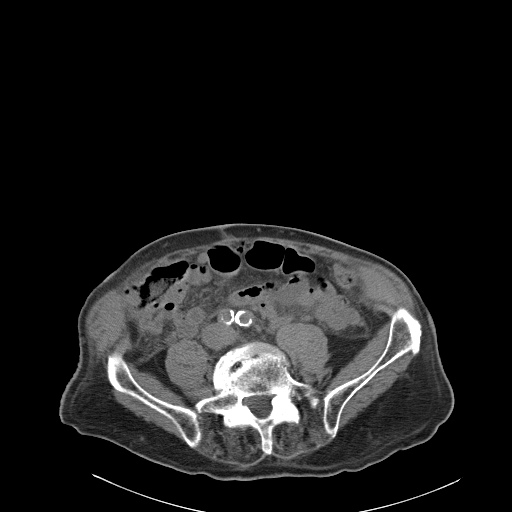
[im 49/94  soft-tissue]
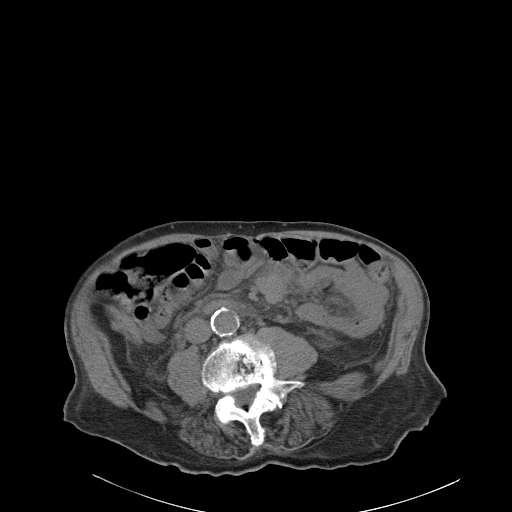
[im 53/94  soft-tissue]
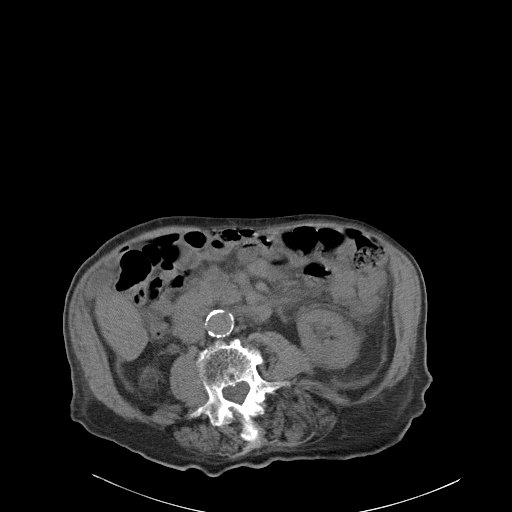
[im 61/94  soft-tissue]
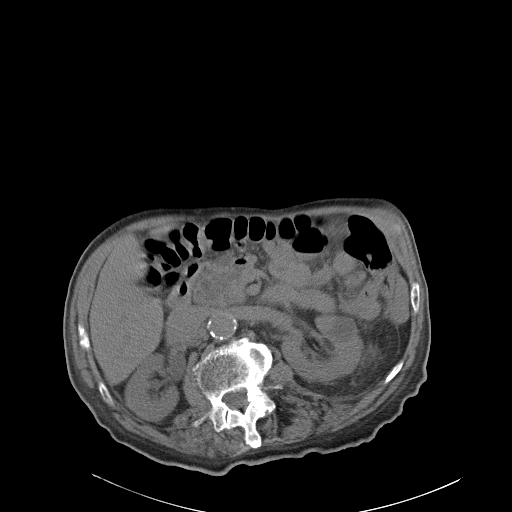
[im 61/94  bone]
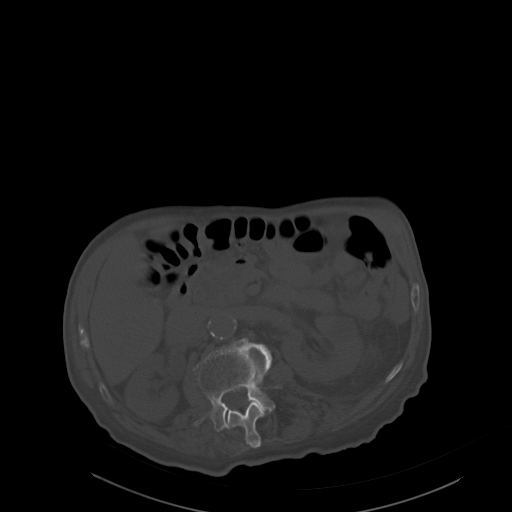
[im 69/94  soft-tissue]
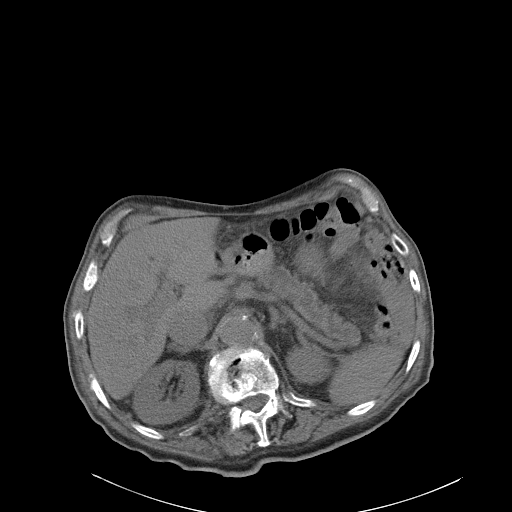
[im 73/94  soft-tissue]
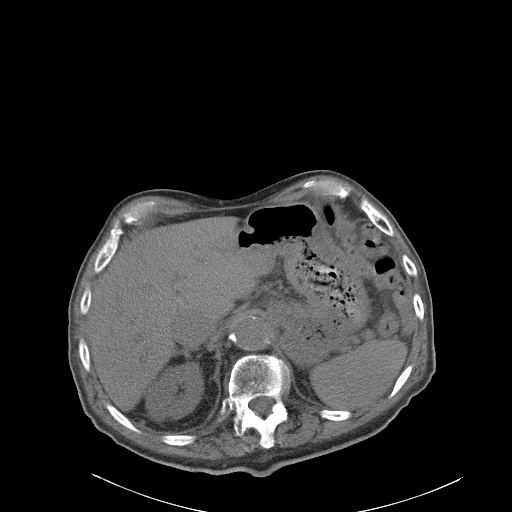
[im 81/94  soft-tissue]
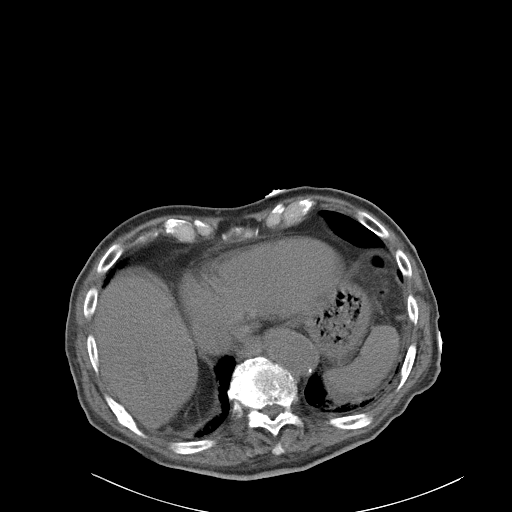
[im 89/94  soft-tissue]
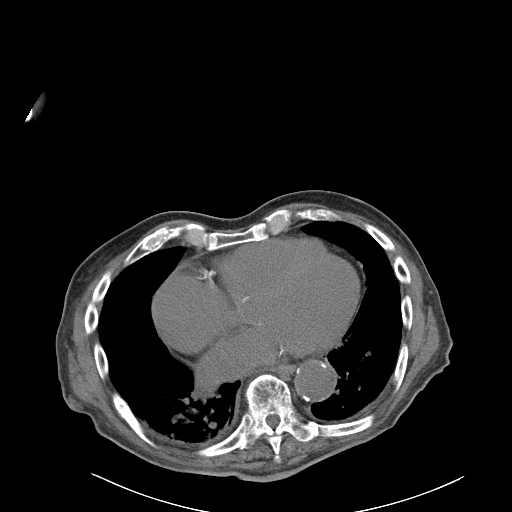

[Series 602: <mpr thick range> · coronal · 0.92mm/px · 3 of 108 slices shown]
[im 36/108  soft-tissue]
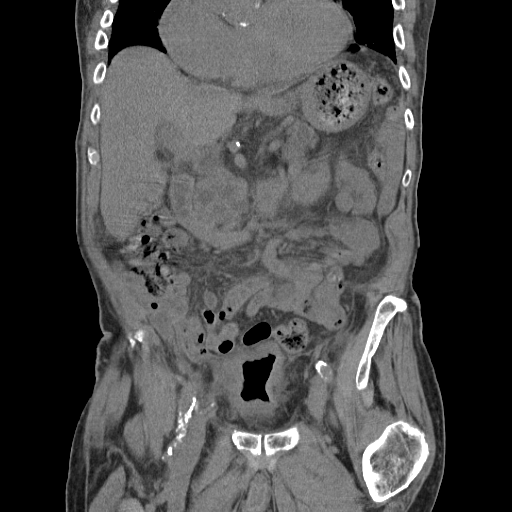
[im 48/108  soft-tissue]
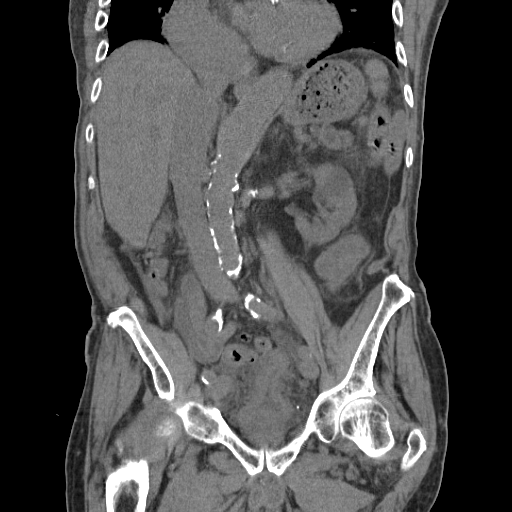
[im 60/108  soft-tissue]
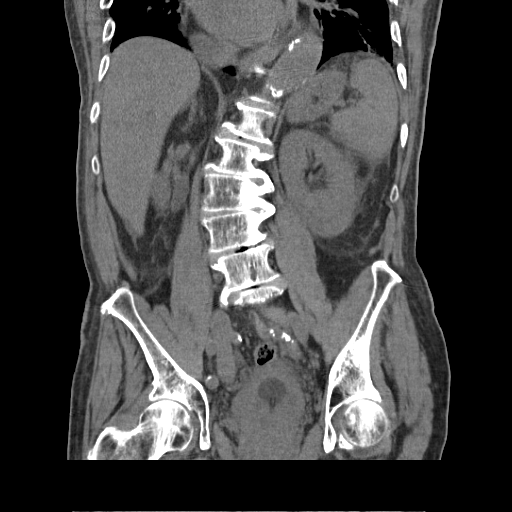

[16 of 46 positions shown; findings below may reference images not displayed]

FINDINGS: Kidneys are normal in size and position. No intrarenal stones. There
is a low-density anterior mid pole left renal mass measuring 3 cm x
1.8 cm, likely a cyst. No other renal masses. There is mild dilation
of portions of the right ureter and a right renal pelvis. No
ureteral stone is seen. Left intrarenal collecting system and ureter
are unremarkable.

Bladder is mostly decompressed by a Foley catheter. The bladder wall
is thickened. There is inflammatory type stranding in the
peri-vesicular fat.

Prostate gland is significantly enlarged. It measures approximate
5.8 cm x 5.6 cm transversely. There is fullness of the right seminal
vesicle.

There is a fat containing right inguinal hernia that also contains
inflammatory type stranding. No bowel enters cysts.

There is a complex cystic mass that enlarged pancreatic head
measuring 6.6 cm x 3.9 cm x 5.5 cm. There is irregularly dilated
pancreatic duct distal to this containing some air, extending
throughout the tail. A cystic pancreatic neoplasm must be
considered. Findings could be the sequela of prior bouts of
pancreatitis with multiple cysts and dilated ducts.

Heart is moderately enlarged. There is dependent subsegmental
atelectasis in the lung bases.

Liver, spleen and gallbladder are unremarkable. No bile duct
dilation. No adrenal masses.

No pathologically enlarged lymph nodes. No ascites. Bowel is
unremarkable.

Atherosclerotic calcifications are noted throughout the abdominal
aorta and its branch vessels. Mild aortic ectasia.

There are advanced degenerative changes throughout the visualized
spine. No osteoblastic or osteolytic lesions.
IMPRESSION: 1. Thickened bladder wall with perivesicular inflammatory type
stranding consistent with a severe cystitis.
2. No other acute findings.
3. Complex cystic mass enlarges the pancreatic head. The chronicity
of this is unclear since there are no prior exams. This could
reflect a cystic neoplasm or be the consequence of previous episodes
of pancreatitis. Followup contrast enhanced CT or, preferably,
pancreatic MRI, is recommended.
4. Other chronic findings include cardiomegaly, a low-density left
renal lesion, likely a cyst, atherosclerotic changes throughout the
aorta and its branch vessels, prostatic enlargement, and advanced
degenerative changes throughout the visualized spine.
